# Patient Record
Sex: Female | Born: 1995 | Race: White | Hispanic: No | Marital: Single
Health system: Midwestern US, Community
[De-identification: ages and names within clinical notes are randomized; demographics above are authoritative.]

## PROBLEM LIST (undated history)

## (undated) DIAGNOSIS — E119 Type 2 diabetes mellitus without complications: Secondary | ICD-10-CM

## (undated) DIAGNOSIS — E079 Disorder of thyroid, unspecified: Secondary | ICD-10-CM

## (undated) DIAGNOSIS — D126 Benign neoplasm of colon, unspecified: Secondary | ICD-10-CM

## (undated) DIAGNOSIS — K449 Diaphragmatic hernia without obstruction or gangrene: Secondary | ICD-10-CM

## (undated) DIAGNOSIS — K760 Fatty (change of) liver, not elsewhere classified: Secondary | ICD-10-CM

## (undated) DIAGNOSIS — K219 Gastro-esophageal reflux disease without esophagitis: Secondary | ICD-10-CM

## (undated) DIAGNOSIS — D1391 Familial adenomatous polyposis: Secondary | ICD-10-CM

## (undated) DIAGNOSIS — E282 Polycystic ovarian syndrome: Secondary | ICD-10-CM

## (undated) HISTORY — PX: CYST REMOVAL LEG: SHX6280

## (undated) HISTORY — PX: COLON SURGERY: SHX602

---

## 2017-04-20 ENCOUNTER — Encounter (HOSPITAL_COMMUNITY): Payer: Self-pay | Admitting: Emergency Medicine

## 2017-04-20 ENCOUNTER — Emergency Department (HOSPITAL_COMMUNITY)
Admission: EM | Admit: 2017-04-20 | Discharge: 2017-04-20 | Disposition: A | Discharge status: Home / Self Care | Payer: Self-pay | Attending: Emergency Medicine | Admitting: Emergency Medicine

## 2017-04-20 DIAGNOSIS — B9789 Other viral agents as the cause of diseases classified elsewhere: Secondary | ICD-10-CM

## 2017-04-20 DIAGNOSIS — J069 Acute upper respiratory infection, unspecified: Secondary | ICD-10-CM | POA: Insufficient documentation

## 2017-04-20 DIAGNOSIS — F17210 Nicotine dependence, cigarettes, uncomplicated: Secondary | ICD-10-CM | POA: Insufficient documentation

## 2017-04-20 HISTORY — DX: Polycystic ovarian syndrome: E28.2

## 2017-04-20 HISTORY — DX: Gastro-esophageal reflux disease without esophagitis: K21.9

## 2017-04-20 MED ORDER — BENZONATATE 100 MG PO CAPS: 100.0000 mg | ORAL_CAPSULE | Freq: Three times a day (TID) | ORAL | 0 refills | Status: DC

## 2017-04-20 MED ORDER — PSEUDOEPHEDRINE HCL 30 MG PO TABS: 30.0000 mg | ORAL_TABLET | Freq: Four times a day (QID) | ORAL | 0 refills | Status: DC | PRN

## 2017-04-20 NOTE — ED Provider Notes (Signed)
[facility] DEPT Provider Note   CSN: [id number] Arrival date & time: 04/20/17  1735     History   Chief Complaint Chief Complaint  Patient presents with  . Cerumen Impaction    HPI Karen Drake is a 22 y.o. female who present to the ED with c/o decreased hearing out of the right ear. Patient tried using Q-tips and it seem to make it worse. Patient reports sinus congestion, dry cough and post nasal drainage. The symptoms started a few days ago.  HPI  Past Medical History:  Diagnosis Date  . Acid reflux   . Polycystic ovary syndrome     There are no active problems to display for this patient.   Past Surgical History:  Procedure Laterality Date  . COLON SURGERY       OB History   None      Home Medications    Prior to Admission medications   Medication Sig Start Date End Date Taking? Authorizing Provider  benzonatate (TESSALON) 100 MG capsule Take 1 capsule (100 mg total) by mouth every 8 (eight) hours. 04/20/17   Ashley Murrain, NP  pseudoephedrine (SUDAFED) 30 MG tablet Take 1 tablet (30 mg total) by mouth every 6 (six) hours as needed for congestion. 04/20/17   Ashley Murrain, NP    Family History No family history on file.  Social History Social History   Tobacco Use  . Smoking status: Current Every Day Smoker  . Smokeless tobacco: Never Used  Substance Use Topics  . Alcohol use: Not on file  . Drug use: Not on file     Allergies   Penicillins   Review of Systems Review of Systems  Constitutional: Negative for chills and fever.  HENT: Positive for ear pain (decreased hearing) and sinus pressure. Negative for sore throat.   Eyes: Negative for discharge and itching.  Respiratory: Positive for cough (dry). Negative for shortness of breath.   Gastrointestinal: Negative for abdominal pain, nausea and vomiting.  Musculoskeletal: Positive for myalgias.  Skin: Negative for rash.  Neurological: Negative for headaches.   Hematological: Negative for adenopathy.  Psychiatric/Behavioral: Negative for confusion.     Physical Exam Updated Vital Signs BP 116/88   Pulse 87   Temp 98.4 F (36.9 C) (Oral)   Resp 16   Ht 5\' 7"  (1.702 m)   Wt 72.6 kg (160 lb)   LMP 04/07/2017   SpO2 100%   BMI 25.06 kg/m   Physical Exam  Constitutional: She appears well-developed and well-nourished. No distress.  HENT:  Head: Normocephalic and atraumatic.  Right Ear: Tympanic membrane normal.  Left Ear: Tympanic membrane normal.  Nose: Mucosal edema and rhinorrhea present.  Mouth/Throat: Uvula is midline, oropharynx is clear and moist and mucous membranes are normal.  Eyes: EOM are normal.  Neck: Neck supple.  Cardiovascular: Normal rate and regular rhythm.  Pulmonary/Chest: Effort normal and breath sounds normal.  Musculoskeletal: Normal range of motion.  Neurological: She is alert.  Skin: Skin is warm and dry.  Psychiatric: She has a normal mood and affect. Her behavior is normal.  Nursing note and vitals reviewed.    ED Treatments / Results  Labs (all labs ordered are listed, but only abnormal results are displayed) Labs Reviewed - No data to display Radiology No results found.  Procedures Procedures (including critical care time)  Medications Ordered in ED Medications - No data to display   Initial Impression / Assessment and Plan / ED Course  I have reviewed the triage vital signs and the nursing notes.  Patients symptoms are consistent with URI, likely viral etiology. Discussed that antibiotics are not indicated for viral infections. Pt will be discharged with symptomatic treatment.  Verbalizes understanding and is agreeable with plan. Pt is hemodynamically stable & in NAD prior to dc. Final Clinical Impressions(s) / ED Diagnoses   Final diagnoses:  Viral URI with cough    ED Discharge Orders        Ordered    benzonatate (TESSALON) 100 MG capsule  Every 8 hours     04/20/17 1901     pseudoephedrine (SUDAFED) 30 MG tablet  Every 6 hours PRN     04/20/17 1902       Debroah Baller [place], NP 04/20/17 2219    Davonna Belling, MD 04/20/17 2329

## 2017-04-20 NOTE — ED Triage Notes (Signed)
Patient states she has been having diffiuclty hearing out of her right ear and now affecting her left ear. Pt tried qtips and OTC ear drops with no relief. Pt also believes she has sinus infection.

## 2017-04-20 NOTE — Discharge Instructions (Signed)
Take tylenol as needed for fever or pain, follow up with your doctor. Return here as needed.

## 2017-04-26 DIAGNOSIS — T7840XA Allergy, unspecified, initial encounter: Secondary | ICD-10-CM | POA: Insufficient documentation

## 2017-04-26 DIAGNOSIS — F17210 Nicotine dependence, cigarettes, uncomplicated: Secondary | ICD-10-CM | POA: Insufficient documentation

## 2017-04-27 ENCOUNTER — Encounter (HOSPITAL_COMMUNITY): Payer: Self-pay

## 2017-04-27 ENCOUNTER — Other Ambulatory Visit: Payer: Self-pay

## 2017-04-27 ENCOUNTER — Encounter (HOSPITAL_COMMUNITY): Payer: Self-pay | Admitting: Emergency Medicine

## 2017-04-27 ENCOUNTER — Emergency Department (HOSPITAL_COMMUNITY)
Admission: EM | Admit: 2017-04-27 | Discharge: 2017-04-27 | Disposition: A | Discharge status: Home / Self Care | Payer: Self-pay | Attending: Emergency Medicine | Admitting: Emergency Medicine

## 2017-04-27 DIAGNOSIS — Z79899 Other long term (current) drug therapy: Secondary | ICD-10-CM | POA: Insufficient documentation

## 2017-04-27 DIAGNOSIS — T7840XD Allergy, unspecified, subsequent encounter: Secondary | ICD-10-CM

## 2017-04-27 DIAGNOSIS — T7840XA Allergy, unspecified, initial encounter: Secondary | ICD-10-CM

## 2017-04-27 DIAGNOSIS — F17210 Nicotine dependence, cigarettes, uncomplicated: Secondary | ICD-10-CM | POA: Insufficient documentation

## 2017-04-27 HISTORY — DX: Fatty (change of) liver, not elsewhere classified: K76.0

## 2017-04-27 HISTORY — DX: Diaphragmatic hernia without obstruction or gangrene: K44.9

## 2017-04-27 MED ORDER — PREDNISONE 10 MG (21) PO TBPK: ORAL_TABLET | Freq: Every day | ORAL | 0 refills | Status: DC

## 2017-04-27 MED ORDER — DIPHENHYDRAMINE HCL 25 MG PO TABS: 25.0000 mg | ORAL_TABLET | Freq: Four times a day (QID) | ORAL | 0 refills | Status: DC

## 2017-04-27 MED ORDER — EPINEPHRINE 0.3 MG/0.3ML IJ SOAJ: 0.3000 mg | Freq: Once | INTRAMUSCULAR | 0 refills | Status: AC

## 2017-04-27 MED ORDER — FAMOTIDINE 20 MG PO TABS: 20.0000 mg | ORAL_TABLET | Freq: Two times a day (BID) | ORAL | 0 refills | Status: DC

## 2017-04-27 MED ORDER — DEXAMETHASONE SODIUM PHOSPHATE 10 MG/ML IJ SOLN
10.0000 mg | Freq: Once | INTRAMUSCULAR | Status: AC
  Administered 2017-04-27: 10 mg via INTRAMUSCULAR
  Filled 2017-04-27: qty 1

## 2017-04-27 MED ORDER — FAMOTIDINE 20 MG PO TABS
40.0000 mg | ORAL_TABLET | Freq: Once | ORAL | Status: AC
  Administered 2017-04-27: 40 mg via ORAL
  Filled 2017-04-27: qty 2

## 2017-04-27 NOTE — ED Provider Notes (Signed)
[facility] DEPT Provider Note   CSN: [id number] Arrival date & time: 04/27/17  1945     History   Chief Complaint Chief Complaint  Patient presents with  . Rash    HPI Karen Drake is a 22 y.o. female with history of PCOS who presents with rash to her left hand, itching of her face, sensation of throat tightening that began around 6:30 PM.  Patient reports she was evaluated here last evening after she was thought to maybe have bitten by a bug.  She was given a Decadron shot and sent home.  Patient was fine until 6:30 PM today when she developed the above symptoms.  She had associated nausea as well.  Patient reports mild shortness of breath related to her sensation of throat tightening.  She denies any abdominal pain, vomiting.  She denies any new soaps, detergents.  She has been on Tessalon for the past couple weeks and she took Excedrin migraine for the first time today.  She also tried chocolate pretzels and water flavoring today for the first time.  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  Patient took 50 mg Benadryl at 7 PM.  HPI  Past Medical History:  Diagnosis Date  . Acid reflux   . Fatty liver   . Hiatal hernia   . Polycystic ovary syndrome     There are no active problems to display for this patient.   Past Surgical History:  Procedure Laterality Date  . COLON SURGERY       OB History   None      Home Medications    Prior to Admission medications   Medication Sig Start Date End Date Taking? Authorizing Provider  benzonatate (TESSALON) 100 MG capsule Take 1 capsule (100 mg total) by mouth every 8 (eight) hours. 04/20/17   Ashley Murrain, NP  diphenhydrAMINE (BENADRYL) 25 MG tablet Take 1 tablet (25 mg total) by mouth every 6 (six) hours. 04/27/17   Sabino Denning, Bea Graff, PA-C  EPINEPHrine (EPIPEN 2-PAK) 0.3 mg/0.3 mL IJ SOAJ injection Inject 0.3 mLs (0.3 mg total) into the muscle once for 1 dose. 04/27/17  04/27/17  Frederica Kuster, PA-C  famotidine (PEPCID) 20 MG tablet Take 1 tablet (20 mg total) by mouth 2 (two) times daily. 04/27/17   Lilburn Straw, Bea Graff, PA-C  predniSONE (STERAPRED UNI-PAK 21 TAB) 10 MG (21) TBPK tablet Take by mouth daily. Take 6 tabs by mouth daily  for 2 days, then 5 tabs for 2 days, then 4 tabs for 2 days, then 3 tabs for 2 days, 2 tabs for 2 days, then 1 tab by mouth daily for 2 days 04/27/17   Frederica Kuster, PA-C  pseudoephedrine (SUDAFED) 30 MG tablet Take 1 tablet (30 mg total) by mouth every 6 (six) hours as needed for congestion. 04/20/17   Ashley Murrain, NP    Family History Family History  Problem Relation Age of Onset  . Cancer Other     Social History Social History   Tobacco Use  . Smoking status: Current Every Day Smoker    Types: Cigarettes  . Smokeless tobacco: Never Used  Substance Use Topics  . Alcohol use: Never    Frequency: Never  . Drug use: Never     Allergies   Penicillins   Review of Systems Review of Systems  Constitutional: Negative for chills and fever.  HENT: Negative for facial swelling, sore throat and trouble swallowing.  Respiratory: Positive for shortness of breath.   Cardiovascular: Negative for chest pain.  Gastrointestinal: Negative for abdominal pain, nausea and vomiting.  Genitourinary: Negative for dysuria.  Musculoskeletal: Negative for back pain.  Skin: Positive for rash. Negative for wound.  Neurological: Negative for headaches.  Psychiatric/Behavioral: The patient is not nervous/anxious.      Physical Exam Updated Vital Signs BP 132/84 (BP Location: Left Arm)   Pulse 100   Temp 97.9 F (36.6 C) (Oral)   Resp 18   Ht 5' 7.25" (1.708 m)   Wt 80.7 kg (178 lb)   LMP 04/07/2017   SpO2 98%   BMI 27.67 kg/m   Physical Exam  Constitutional: She appears well-developed and well-nourished. No distress.  HENT:  Head: Normocephalic and atraumatic.  Mouth/Throat: Oropharynx is clear and moist. No  oropharyngeal exudate.  Patient's oropharynx is clear, no swelling of the tongue, lips, posterior oropharynx  Eyes: Pupils are equal, round, and reactive to light. Conjunctivae are normal. Right eye exhibits no discharge. Left eye exhibits no discharge. No scleral icterus.  Neck: Normal range of motion. Neck supple. No thyromegaly present.  Cardiovascular: Normal rate, regular rhythm, normal heart sounds and intact distal pulses. Exam reveals no gallop and no friction rub.  No murmur heard. Pulmonary/Chest: Effort normal and breath sounds normal. No stridor. No respiratory distress. She has no wheezes. She has no rales.  Abdominal: Soft. Bowel sounds are normal. She exhibits no distension. There is no tenderness. There is no rebound and no guarding.  Musculoskeletal: She exhibits no edema.  Lymphadenopathy:    She has no cervical adenopathy.  Neurological: She is alert. Coordination normal.  Skin: Skin is warm and dry. No rash noted. She is not diaphoretic. No pallor.  Very subtle erythematous rash to dorsal thenar eminence of the left hand, no tenderness  Psychiatric: She has a normal mood and affect.  Nursing note and vitals reviewed.    ED Treatments / Results  Labs (all labs ordered are listed, but only abnormal results are displayed) Labs Reviewed - No data to display  EKG None  Radiology No results found.  Procedures Procedures (including critical care time)  Medications Ordered in ED Medications  famotidine (PEPCID) tablet 40 mg (has no administration in time range)     Initial Impression / Assessment and Plan / ED Course  I have reviewed the triage vital signs and the nursing notes.  Pertinent labs & imaging results that were available during my care of the patient were reviewed by me and considered in my medical decision making (see chart for details).     Patient with suspected allergic reaction.  Patient is very well-appearing with stable vitals.  She has  clear oropharynx and is sitting comfortably on the examination chair.  She is in no acute distress.  There is no stridor.  Lungs are clear to auscultation.  Patient given Pepcid in the ED.  Will discharge home with Pepcid, continue Benadryl, and steroid taper to begin tomorrow.  Patient also given EpiPen.  Patient counseled on reasons to use this and to go to nearest ED for evaluation and monitoring following use.  No signs of anaphylaxis at this time requiring EpiPen.  Strict return precautions given.  Will refer to PCP for allergy testing, as allergen unknown. Patient understands and agrees with plan.  Patient vital stable throughout ED course and discharged in satisfactory condition. I discussed patient case with Dr. Marcha Dutton who guided the patient's management and agrees with plan.  Final Clinical Impressions(s) / ED Diagnoses   Final diagnoses:  Allergic reaction, subsequent encounter    ED Discharge Orders        Ordered    predniSONE (STERAPRED UNI-PAK 21 TAB) 10 MG (21) TBPK tablet  Daily     04/27/17 2055    famotidine (PEPCID) 20 MG tablet  2 times daily     04/27/17 2055    diphenhydrAMINE (BENADRYL) 25 MG tablet  Every 6 hours     04/27/17 2055    EPINEPHrine (EPIPEN 2-PAK) 0.3 mg/0.3 mL IJ SOAJ injection   Once     04/27/17 2056       Frederica Kuster, PA-C 04/27/17 2106    Pixie Casino, MD 04/27/17 2112

## 2017-04-27 NOTE — Discharge Instructions (Addendum)
Please continue Benadryl as needed for itching Apply cool compresses to hand Return if worsening

## 2017-04-27 NOTE — Discharge Instructions (Signed)
Take Benadryl every 6 hours for the next several days.  Take Pepcid twice daily as well.  Beginning tomorrow the next day, start taking prednisone taper.  In the case of closing of your throat, severe shortness of breath, use EpiPen and proceed to the closest emergency department.  Have EpiPen on hand with you at all times.  Please follow-up with primary care office below for further management and referral to allergy testing.  Please return to the emergency department if you develop any new or worsening symptoms.

## 2017-04-27 NOTE — ED Triage Notes (Signed)
Pt reports that she was seen here yesterday for a allergic  reaction. Pt reports that she had a steroid shot yesterday, and today feels worse and has itching to her face and hands. Pt reports that she has been SOB but is able to talk in full sentences. Pt throat does not appear to be swollen.  Marland Kitchen

## 2017-04-27 NOTE — ED Provider Notes (Signed)
[facility] DEPT Provider Note   CSN: [id number] Arrival date & time: 04/26/17  2217     History   Chief Complaint Chief Complaint  Patient presents with  . Insect Bite    HPI Karen Drake is a 22 y.o. female who presents with redness and swelling of her right hand.  He states that she is outside letting her dogs out when she started to have pain in her right hand.  She noticed that there was some redness over the dorsal aspect of her hand and it was mildly swollen.  This was about 230 this afternoon.  Since then she has had progressive numbness and tingling of her hand and it has been radiating up her arm into her face.  She also feels this on her left arm as well.  She took Benadryl without significant relief therefore she came to the ED.  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  She denies throat swelling, shortness of breath, wheezing, nausea or vomiting.  HPI  Past Medical History:  Diagnosis Date  . Acid reflux   . Fatty liver   . Hiatal hernia   . Polycystic ovary syndrome     There are no active problems to display for this patient.   Past Surgical History:  Procedure Laterality Date  . COLON SURGERY       OB History   None      Home Medications    Prior to Admission medications   Medication Sig Start Date End Date Taking? Authorizing Provider  benzonatate (TESSALON) 100 MG capsule Take 1 capsule (100 mg total) by mouth every 8 (eight) hours. 04/20/17   Ashley Murrain, NP  pseudoephedrine (SUDAFED) 30 MG tablet Take 1 tablet (30 mg total) by mouth every 6 (six) hours as needed for congestion. 04/20/17   Ashley Murrain, NP    Family History Family History  Problem Relation Age of Onset  . Cancer Other     Social History Social History   Tobacco Use  . Smoking status: Current Every Day Smoker    Types: Cigarettes  . Smokeless tobacco: Never Used  Substance Use  Topics  . Alcohol use: Never    Frequency: Never  . Drug use: Never     Allergies   Penicillins   Review of Systems Review of Systems  HENT: Negative for trouble swallowing.   Respiratory: Negative for shortness of breath.   Gastrointestinal: Negative for abdominal pain, nausea and vomiting.  Skin: Positive for color change and rash.     Physical Exam Updated Vital Signs BP 117/80 (BP Location: Left Arm)   Pulse 84   Temp 98.7 F (37.1 C) (Oral)   Resp 15   Ht 5\' 7"  (1.702 m)   Wt 81 kg (178 lb 8 oz)   LMP 04/07/2017   SpO2 99%   BMI 27.96 kg/m   Physical Exam  Constitutional: She is oriented to person, place, and time. She appears well-developed and well-nourished. No distress.  Calm, cooperative, no acute distress  HENT:  Head: Normocephalic and atraumatic.  Eyes: Pupils are equal, round, and reactive to light. Conjunctivae are normal. Right eye exhibits no discharge. Left eye exhibits no discharge. No scleral icterus.  Neck: Normal range of motion.  Cardiovascular: Normal rate and regular rhythm.  Pulmonary/Chest: Effort normal and breath sounds normal. No respiratory distress.  Abdominal: She exhibits no distension.  Musculoskeletal:  Mild redness and swelling over the dorsal aspect of the right hand.  2+ radial pulse.  Neurological: She is alert and oriented to person, place, and time.  Skin: Skin is warm and dry.  Psychiatric: She has a normal mood and affect. Her behavior is normal.  Nursing note and vitals reviewed.    ED Treatments / Results  Labs (all labs ordered are listed, but only abnormal results are displayed) Labs Reviewed - No data to display  EKG None  Radiology No results found.  Procedures Procedures (including critical care time)  Medications Ordered in ED Medications  dexamethasone (DECADRON) injection 10 mg (has no administration in time range)     Initial Impression / Assessment and Plan / ED Course  I have reviewed  the triage vital signs and the nursing notes.  Pertinent labs & imaging results that were available during my care of the patient were reviewed by me and considered in my medical decision making (see chart for details).  22 year old female with possible allergic reaction.  Her vital signs are normal.  On exam she is in no acute distress.  She reports no improvement with Benadryl.  Will give IM Decadron and advised to continue Benadryl and apply cool compresses to the area.  Advised to return if worsening.  Final Clinical Impressions(s) / ED Diagnoses   Final diagnoses:  Allergic reaction, initial encounter    ED Discharge Orders    None       Recardo Evangelist, PA-C 04/27/17 0050    Ward, Delice Bison, DO 04/27/17 [phone]

## 2017-04-27 NOTE — ED Triage Notes (Signed)
Pt states she went outside today around 230 and when she came back in her right hand was red and she had pain in it  Since the pain has gone up her arm and now she is c/o numbness to her face  Pt states she took 25mg  of benadryl around 2130   Pt has redness noted to her hand

## 2017-06-27 ENCOUNTER — Other Ambulatory Visit: Payer: Self-pay

## 2017-06-27 ENCOUNTER — Emergency Department (HOSPITAL_COMMUNITY)
Admission: EM | Admit: 2017-06-27 | Discharge: 2017-06-27 | Disposition: A | Discharge status: Home / Self Care | Payer: Medicaid Other | Attending: Emergency Medicine | Admitting: Emergency Medicine

## 2017-06-27 ENCOUNTER — Encounter (HOSPITAL_COMMUNITY): Payer: Self-pay

## 2017-06-27 DIAGNOSIS — K2100 Gastro-esophageal reflux disease with esophagitis, without bleeding: Secondary | ICD-10-CM

## 2017-06-27 DIAGNOSIS — F17210 Nicotine dependence, cigarettes, uncomplicated: Secondary | ICD-10-CM | POA: Insufficient documentation

## 2017-06-27 DIAGNOSIS — G43009 Migraine without aura, not intractable, without status migrainosus: Secondary | ICD-10-CM

## 2017-06-27 DIAGNOSIS — K219 Gastro-esophageal reflux disease without esophagitis: Secondary | ICD-10-CM | POA: Insufficient documentation

## 2017-06-27 LAB — URINALYSIS, ROUTINE W REFLEX MICROSCOPIC
Bilirubin Urine: NEGATIVE
Glucose, UA: NEGATIVE mg/dL
Hgb urine dipstick: NEGATIVE
KETONES UR: NEGATIVE mg/dL
LEUKOCYTES UA: NEGATIVE
NITRITE: NEGATIVE
PH: 5 (ref 5.0–8.0)
Protein, ur: NEGATIVE mg/dL
SPECIFIC GRAVITY, URINE: 1.009 (ref 1.005–1.030)

## 2017-06-27 LAB — CBC
HEMATOCRIT: 41.1 % (ref 36.0–46.0)
HEMOGLOBIN: 13.5 g/dL (ref 12.0–15.0)
MCH: 29.2 pg (ref 26.0–34.0)
MCHC: 32.8 g/dL (ref 30.0–36.0)
MCV: 88.8 fL (ref 78.0–100.0)
Platelets: 355 10*3/uL (ref 150–400)
RBC: 4.63 MIL/uL (ref 3.87–5.11)
RDW: 13.3 % (ref 11.5–15.5)
WBC: 9.4 10*3/uL (ref 4.0–10.5)

## 2017-06-27 LAB — COMPREHENSIVE METABOLIC PANEL
ALT: 22 U/L (ref 0–44)
ANION GAP: 8 (ref 5–15)
AST: 29 U/L (ref 15–41)
Albumin: 4.4 g/dL (ref 3.5–5.0)
Alkaline Phosphatase: 50 U/L (ref 38–126)
BILIRUBIN TOTAL: 0.7 mg/dL (ref 0.3–1.2)
BUN: 16 mg/dL (ref 6–20)
CALCIUM: 9.5 mg/dL (ref 8.9–10.3)
CO2: 25 mmol/L (ref 22–32)
Chloride: 107 mmol/L (ref 98–111)
Creatinine, Ser: 1 mg/dL (ref 0.44–1.00)
GFR calc Af Amer: >60 mL/min (ref >60)
Glucose, Bld: 105 mg/dL — ABNORMAL HIGH (ref 70–99)
POTASSIUM: 4.6 mmol/L (ref 3.5–5.1)
Sodium: 140 mmol/L (ref 135–145)
TOTAL PROTEIN: 7.5 g/dL (ref 6.5–8.1)

## 2017-06-27 LAB — I-STAT BETA HCG BLOOD, ED (MC, WL, AP ONLY)

## 2017-06-27 LAB — LIPASE, BLOOD: Lipase: 35 U/L (ref 11–51)

## 2017-06-27 MED ORDER — SODIUM CHLORIDE 0.9 % IV BOLUS
1000.0000 mL | Freq: Once | INTRAVENOUS | Status: AC
  Administered 2017-06-27: 1000 mL via INTRAVENOUS

## 2017-06-27 MED ORDER — SUCRALFATE 1 G PO TABS
1.0000 g | ORAL_TABLET | Freq: Once | ORAL | Status: AC
  Administered 2017-06-27: 1 g via ORAL
  Filled 2017-06-27: qty 1

## 2017-06-27 MED ORDER — METOCLOPRAMIDE HCL 5 MG/ML IJ SOLN
10.0000 mg | Freq: Once | INTRAMUSCULAR | Status: AC
Start: 2017-06-27 — End: 2017-06-27
  Administered 2017-06-27: 10 mg via INTRAVENOUS
  Filled 2017-06-27: qty 2

## 2017-06-27 MED ORDER — DIPHENHYDRAMINE HCL 50 MG/ML IJ SOLN
12.5000 mg | Freq: Once | INTRAMUSCULAR | Status: AC
  Administered 2017-06-27: 12.5 mg via INTRAVENOUS
  Filled 2017-06-27: qty 1

## 2017-06-27 MED ORDER — PANTOPRAZOLE SODIUM 40 MG IV SOLR
40.0000 mg | Freq: Once | INTRAVENOUS | Status: AC
  Administered 2017-06-27: 40 mg via INTRAVENOUS
  Filled 2017-06-27: qty 40

## 2017-06-27 MED ORDER — SODIUM CHLORIDE 0.9 % IV SOLN
INTRAVENOUS | Status: DC
  Administered 2017-06-27: 17:00:00 via INTRAVENOUS

## 2017-06-27 NOTE — ED Provider Notes (Signed)
[facility] DEPT Provider Note   CSN: [id number] Arrival date & time: 06/27/17  1337     History   Chief Complaint Chief Complaint  Patient presents with  . Emesis  . Migraine  . Abdominal Pain    HPI Karen Drake is a 22 y.o. female.  This is a 22 year old female presents with epigastric abdominal pain times several days.  Patient has a known history of acid reflux as well as hiatal hernia and this feels similar.  Is been unresponsive to her home medications.  Her emesis has been nonbilious or bloody.  No fever or chills.  Pain does not radiate to her back.  No associated urinary or GYN symptoms such as vaginal bleeding or discharge.  Has been belching more.  Has not had much of an appetite.  Does have a gastroenterologist but she has not seen him recently.     Past Medical History:  Diagnosis Date  . Acid reflux   . Fatty liver   . Hiatal hernia   . Polycystic ovary syndrome     There are no active problems to display for this patient.   Past Surgical History:  Procedure Laterality Date  . COLON SURGERY       OB History   None      Home Medications    Prior to Admission medications   Medication Sig Start Date End Date Taking? Authorizing Provider  benzonatate (TESSALON) 100 MG capsule Take 1 capsule (100 mg total) by mouth every 8 (eight) hours. 04/20/17   Ashley Murrain, NP  diphenhydrAMINE (BENADRYL) 25 MG tablet Take 1 tablet (25 mg total) by mouth every 6 (six) hours. 04/27/17   Law, Bea Graff, PA-C  famotidine (PEPCID) 20 MG tablet Take 1 tablet (20 mg total) by mouth 2 (two) times daily. 04/27/17   Law, Alexandra M, PA-C  MICROGESTIN 1-20 MG-MCG tablet Take 1 tablet by mouth daily. 04/18/17   [provider]  predniSONE (STERAPRED UNI-PAK 21 TAB) 10 MG (21) TBPK tablet Take by mouth daily. Take 6 tabs by mouth daily  for 2 days, then 5 tabs for 2 days, then 4 tabs for 2 days, then 3 tabs for 2 days, 2 tabs for 2  days, then 1 tab by mouth daily for 2 days 04/27/17   Frederica Kuster, PA-C  pseudoephedrine (SUDAFED) 30 MG tablet Take 1 tablet (30 mg total) by mouth every 6 (six) hours as needed for congestion. 04/20/17   Ashley Murrain, NP    Family History Family History  Problem Relation Age of Onset  . Cancer Other     Social History Social History   Tobacco Use  . Smoking status: Current Every Day Smoker    Packs/day: 1.00    Types: Cigarettes  . Smokeless tobacco: Never Used  Substance Use Topics  . Alcohol use: Never    Frequency: Never  . Drug use: Never     Allergies   Penicillins and Pineapple   Review of Systems Review of Systems  All other systems reviewed and are negative.    Physical Exam Updated Vital Signs BP 121/82 (BP Location: Left Arm)   Pulse 98   Temp 97.9 F (36.6 C) (Oral)   Resp 18   Ht 1.708 m (5' 7.25")   Wt 81.2 kg (179 lb)   LMP 04/27/2017 Comment: irregular periods  SpO2 100%   BMI 27.83 kg/m   Physical Exam  Constitutional: She is oriented to person,  place, and time. She appears well-developed and well-nourished.  Non-toxic appearance. No distress.  HENT:  Head: Normocephalic and atraumatic.  Eyes: Pupils are equal, round, and reactive to light. Conjunctivae, EOM and lids are normal.  Neck: Normal range of motion. Neck supple. No tracheal deviation present. No thyroid mass present.  Cardiovascular: Normal rate, regular rhythm and normal heart sounds. Exam reveals no gallop.  No murmur heard. Pulmonary/Chest: Effort normal and breath sounds normal. No stridor. No respiratory distress. She has no decreased breath sounds. She has no wheezes. She has no rhonchi. She has no rales.  Abdominal: Soft. Normal appearance and bowel sounds are normal. She exhibits no distension. There is tenderness in the epigastric area. There is no rigidity, no rebound, no guarding and no CVA tenderness.    Musculoskeletal: Normal range of motion. She exhibits no  edema or tenderness.  Neurological: She is alert and oriented to person, place, and time. She has normal strength. No cranial nerve deficit or sensory deficit. GCS eye subscore is 4. GCS verbal subscore is 5. GCS motor subscore is 6.  Skin: Skin is warm and dry. No abrasion and no rash noted.  Psychiatric: She has a normal mood and affect. Her speech is normal and behavior is normal.  Nursing note and vitals reviewed.    ED Treatments / Results  Labs (all labs ordered are listed, but only abnormal results are displayed) Labs Reviewed  COMPREHENSIVE METABOLIC PANEL - Abnormal; Notable for the following components:      Result Value   Glucose, Bld 105 (*)    All other components within normal limits  LIPASE, BLOOD  CBC  URINALYSIS, ROUTINE W REFLEX MICROSCOPIC  I-STAT BETA HCG BLOOD, ED ([facility], [facility], [facility] ONLY)    EKG None  Radiology No results found.  Procedures Procedures (including critical care time)  Medications Ordered in ED Medications  sodium chloride 0.9 % bolus 1,000 mL (has no administration in time range)  0.9 %  sodium chloride infusion (has no administration in time range)  metoCLOPramide (REGLAN) injection 10 mg (has no administration in time range)  diphenhydrAMINE (BENADRYL) injection 12.5 mg (has no administration in time range)  sucralfate (CARAFATE) tablet 1 g (has no administration in time range)  pantoprazole (PROTONIX) injection 40 mg (has no administration in time range)     Initial Impression / Assessment and Plan / ED Course  I have reviewed the triage vital signs and the nursing notes.  Pertinent labs & imaging results that were available during my care of the patient were reviewed by me and considered in my medical decision making (see chart for details).     Patient treated with IV fluids as well as given antiemetics for her migraine headache.  Also treated for acid reflux and feels much better at this time.  No gross focal neurological deficits.   She is stable for discharge  Final Clinical Impressions(s) / ED Diagnoses   Final diagnoses:  None    ED Discharge Orders    None       Lacretia Leigh, MD 06/27/17 1807

## 2017-06-27 NOTE — ED Triage Notes (Signed)
Patient c/o vomiting, abdominal burning, and migraine headache since this AM.

## 2017-10-04 ENCOUNTER — Encounter (HOSPITAL_COMMUNITY): Payer: Self-pay | Admitting: Emergency Medicine

## 2017-10-04 ENCOUNTER — Emergency Department (HOSPITAL_COMMUNITY)
Admission: EM | Admit: 2017-10-04 | Discharge: 2017-10-04 | Disposition: A | Discharge status: Home / Self Care | Payer: Medicaid Other | Attending: Emergency Medicine | Admitting: Emergency Medicine

## 2017-10-04 DIAGNOSIS — L03039 Cellulitis of unspecified toe: Secondary | ICD-10-CM | POA: Insufficient documentation

## 2017-10-04 DIAGNOSIS — L03032 Cellulitis of left toe: Secondary | ICD-10-CM

## 2017-10-04 DIAGNOSIS — F17210 Nicotine dependence, cigarettes, uncomplicated: Secondary | ICD-10-CM | POA: Insufficient documentation

## 2017-10-04 MED ORDER — LIDOCAINE HCL 2 % IJ SOLN
20.0000 mL | Freq: Once | INTRAMUSCULAR | Status: AC
  Administered 2017-10-04: 400 mg via INTRADERMAL
  Filled 2017-10-04: qty 20

## 2017-10-04 NOTE — ED Provider Notes (Signed)
[facility] DEPT Provider Note   CSN: [id number] Arrival date & time: 10/04/17  1438     History   Chief Complaint Chief Complaint  Patient presents with  . Toe Pain    HPI Karen Drake is a 22 y.o. female.  HPI   Karen Drake is a 22 year old female with no significant past medical history who presents to the emergency department for evaluation of left great toe infection.  Patient reports that she had a pedicure 2 weeks ago for the first time.  About a week ago she noticed some redness next to the great toe nail.  Today she noticed that there was a area of swelling which looks like it contains pus.  She reports that she has moderate pain over the area, particularly with weightbearing.  She has not taken any over-the-counter medications for symptoms.  She denies fevers, chills.  No recent injury to the foot or toe.  No history of diabetes or immunocompromise.  Past Medical History:  Diagnosis Date  . Acid reflux   . Fatty liver   . Hiatal hernia   . Polycystic ovary syndrome     There are no active problems to display for this patient.   Past Surgical History:  Procedure Laterality Date  . COLON SURGERY       OB History   None      Home Medications    Prior to Admission medications   Medication Sig Start Date End Date Taking? Authorizing Provider  aspirin-acetaminophen-caffeine (EXCEDRIN MIGRAINE) [phone] MG tablet Take 2 tablets by mouth every 6 (six) hours as needed for headache or migraine.    [provider]  benzonatate (TESSALON) 100 MG capsule Take 1 capsule (100 mg total) by mouth every 8 (eight) hours. Patient not taking: Reported on 06/27/2017 04/20/17   Ashley Murrain, NP  diphenhydrAMINE (BENADRYL) 25 MG tablet Take 1 tablet (25 mg total) by mouth every 6 (six) hours. Patient taking differently: Take 25 mg by mouth every 6 (six) hours as needed for itching or allergies.  04/27/17   Law, Bea Graff, PA-C    esomeprazole (NEXIUM) 40 MG capsule Take 40 mg by mouth daily at 12 noon.    [provider]  famotidine (PEPCID) 20 MG tablet Take 1 tablet (20 mg total) by mouth 2 (two) times daily. Patient not taking: Reported on 06/27/2017 04/27/17   Frederica Kuster, PA-C  MICROGESTIN 1-20 MG-MCG tablet Take 1 tablet by mouth daily. 04/18/17   [provider]  Multiple Vitamins-Minerals (MULTIVITAMIN GUMMIES ADULTS PO) Take 2 each by mouth daily.    [provider]  predniSONE (STERAPRED UNI-PAK 21 TAB) 10 MG (21) TBPK tablet Take by mouth daily. Take 6 tabs by mouth daily  for 2 days, then 5 tabs for 2 days, then 4 tabs for 2 days, then 3 tabs for 2 days, 2 tabs for 2 days, then 1 tab by mouth daily for 2 days Patient not taking: Reported on 06/27/2017 04/27/17   Frederica Kuster, PA-C  pseudoephedrine (SUDAFED) 30 MG tablet Take 1 tablet (30 mg total) by mouth every 6 (six) hours as needed for congestion. Patient not taking: Reported on 06/27/2017 04/20/17   Ashley Murrain, NP  ranitidine (ZANTAC) 300 MG tablet Take 300 mg by mouth daily.    [provider]    Family History Family History  Problem Relation Age of Onset  . Cancer Other     Social History Social History  Tobacco Use  . Smoking status: Current Every Day Smoker    Packs/day: 1.00    Types: Cigarettes  . Smokeless tobacco: Never Used  Substance Use Topics  . Alcohol use: Never    Frequency: Never  . Drug use: Never     Allergies   Penicillins and Pineapple   Review of Systems Review of Systems  Constitutional: Negative for chills and fever.  Musculoskeletal: Positive for arthralgias (left great toe).  Skin: Positive for color change (left great toe).     Physical Exam Updated Vital Signs BP 125/74 (BP Location: Left Arm)   Pulse 95   Temp 98.6 F (37 C) (Oral)   Resp 17   Ht 5' 7.25" (1.708 m)   Wt 90 kg   SpO2 99%   BMI 30.84 kg/m   Physical Exam  Constitutional: She  appears well-developed and well-nourished. No distress.  HENT:  Head: Normocephalic and atraumatic.  Eyes: Right eye exhibits no discharge. Left eye exhibits no discharge.  Pulmonary/Chest: Effort normal. No respiratory distress.  Musculoskeletal:  Left great toe with erythema and area of fluctuance lateral to the nail. No tenderness or swelling over the pad of the toe. Cap refill <2sec.   Neurological: She is alert. Coordination normal.  Skin: Skin is warm and dry. She is not diaphoretic.  Psychiatric: She has a normal mood and affect. Her behavior is normal.  Nursing note and vitals reviewed.   ED Treatments / Results  Labs (all labs ordered are listed, but only abnormal results are displayed) Labs Reviewed - No data to display  EKG None  Radiology No results found.  Procedures Drain paronychia Date/Time: 10/04/2017 5:42 PM Performed by: Glyn Ade, PA-C Authorized by: Glyn Ade, PA-C  Consent: Verbal consent obtained. Consent given by: patient Patient understanding: patient states understanding of the procedure being performed Patient consent: the patient's understanding of the procedure matches consent given Procedure consent: procedure consent matches procedure scheduled Relevant documents: relevant documents present and verified Site marked: the operative site was marked Patient identity confirmed: verbally with patient Time out: Immediately prior to procedure a "time out" was called to verify the correct patient, procedure, equipment, support staff and site/side marked as required. Preparation: Patient was prepped and draped in the usual sterile fashion. Local anesthesia used: yes Anesthesia: digital block  Anesthesia: Local anesthesia used: yes Local Anesthetic: lidocaine 2% without epinephrine Anesthetic total: 4 mL  Sedation: Patient sedated: no  Patient tolerance: Patient tolerated the procedure well with no immediate complications     (including critical care time)  Medications Ordered in ED Medications  lidocaine (XYLOCAINE) 2 % (with pres) injection 400 mg (400 mg Intradermal Given 10/04/17 1623)     Initial Impression / Assessment and Plan / ED Course  I have reviewed the triage vital signs and the nursing notes.  Pertinent labs & imaging results that were available during my care of the patient were reviewed by me and considered in my medical decision making (see chart for details).     Patient presents with paronychia which was amenable to incision and drainage in the emergency department.  No erythema, swelling or fluctuance over the pad of the toe to suggest felon.  Patient counseled on warm soaks at home.  Discussed return precautions and she agrees.  Final Clinical Impressions(s) / ED Diagnoses   Final diagnoses:  Paronychia of great toe of left foot    ED Discharge Orders    None  Glyn Ade, PA-C 10/04/17 1745    Jola Schmidt, MD 10/05/17 2139

## 2017-10-04 NOTE — ED Notes (Signed)
Patient verbalized understanding of discharge instructions, no questions. Patient ambulated out of ED with steady gait in no distress.  

## 2017-10-04 NOTE — Discharge Instructions (Signed)
Soak in warm soapy water at least twice a day for the next week.  We will continue to drain for a few days, this is normal.  Return to the ER if you have any new or concerning symptoms like fever, worsening redness or swelling over the toe.

## 2017-10-04 NOTE — ED Triage Notes (Signed)
Pt reports that she got a pedicure couple weeks ago and notice around left bug toe looks like pus. Reports is painful.

## 2017-10-30 ENCOUNTER — Encounter (HOSPITAL_COMMUNITY): Payer: Self-pay

## 2017-10-30 ENCOUNTER — Emergency Department (HOSPITAL_COMMUNITY): Payer: Self-pay

## 2017-10-30 ENCOUNTER — Other Ambulatory Visit: Payer: Self-pay

## 2017-10-30 ENCOUNTER — Emergency Department (HOSPITAL_COMMUNITY)
Admission: EM | Admit: 2017-10-30 | Discharge: 2017-10-31 | Disposition: A | Discharge status: Home / Self Care | Payer: Self-pay | Attending: Emergency Medicine | Admitting: Emergency Medicine

## 2017-10-30 DIAGNOSIS — E119 Type 2 diabetes mellitus without complications: Secondary | ICD-10-CM | POA: Insufficient documentation

## 2017-10-30 DIAGNOSIS — F17210 Nicotine dependence, cigarettes, uncomplicated: Secondary | ICD-10-CM | POA: Insufficient documentation

## 2017-10-30 DIAGNOSIS — R112 Nausea with vomiting, unspecified: Secondary | ICD-10-CM | POA: Insufficient documentation

## 2017-10-30 DIAGNOSIS — Z79899 Other long term (current) drug therapy: Secondary | ICD-10-CM | POA: Insufficient documentation

## 2017-10-30 DIAGNOSIS — R748 Abnormal levels of other serum enzymes: Secondary | ICD-10-CM

## 2017-10-30 DIAGNOSIS — R195 Other fecal abnormalities: Secondary | ICD-10-CM

## 2017-10-30 DIAGNOSIS — R1011 Right upper quadrant pain: Secondary | ICD-10-CM

## 2017-10-30 DIAGNOSIS — R1013 Epigastric pain: Secondary | ICD-10-CM

## 2017-10-30 DIAGNOSIS — R3129 Other microscopic hematuria: Secondary | ICD-10-CM

## 2017-10-30 HISTORY — DX: Disorder of thyroid, unspecified: E07.9

## 2017-10-30 HISTORY — DX: Type 2 diabetes mellitus without complications: E11.9

## 2017-10-30 LAB — CBC
HEMATOCRIT: 40.6 % (ref 36.0–46.0)
HEMATOCRIT: 39.5 % (ref 36.0–46.0)
HEMOGLOBIN: 12.9 g/dL (ref 12.0–15.0)
HEMOGLOBIN: 12.6 g/dL (ref 12.0–15.0)
MCH: 28.9 pg (ref 26.0–34.0)
MCH: 29 pg (ref 26.0–34.0)
MCHC: 31.8 g/dL (ref 30.0–36.0)
MCHC: 31.9 g/dL (ref 30.0–36.0)
MCV: 91 fL (ref 80.0–100.0)
MCV: 90.8 fL (ref 80.0–100.0)
Platelets: 293 10*3/uL (ref 150–400)
Platelets: 342 10*3/uL (ref 150–400)
RBC: 4.46 MIL/uL (ref 3.87–5.11)
RBC: 4.35 MIL/uL (ref 3.87–5.11)
RDW: 13.3 % (ref 11.5–15.5)
RDW: 13.3 % (ref 11.5–15.5)
WBC: 10.6 10*3/uL — AB (ref 4.0–10.5)
WBC: 11.9 10*3/uL — ABNORMAL HIGH (ref 4.0–10.5)
nRBC: 0 % (ref 0.0–0.2)
nRBC: 0 % (ref 0.0–0.2)

## 2017-10-30 LAB — COMPREHENSIVE METABOLIC PANEL
ALBUMIN: 4.4 g/dL (ref 3.5–5.0)
ALK PHOS: 45 U/L (ref 38–126)
ALT: 45 U/L — AB (ref 0–44)
AST: 94 U/L — AB (ref 15–41)
Anion gap: 10 (ref 5–15)
BILIRUBIN TOTAL: 0.5 mg/dL (ref 0.3–1.2)
BUN: 10 mg/dL (ref 6–20)
CHLORIDE: 105 mmol/L (ref 98–111)
CO2: 23 mmol/L (ref 22–32)
Calcium: 9.5 mg/dL (ref 8.9–10.3)
Creatinine, Ser: 0.83 mg/dL (ref 0.44–1.00)
GFR calc Af Amer: >60 mL/min (ref >60)
GLUCOSE: 108 mg/dL — AB (ref 70–99)
POTASSIUM: 3.5 mmol/L (ref 3.5–5.1)
Sodium: 138 mmol/L (ref 135–145)
Total Protein: 7.5 g/dL (ref 6.5–8.1)

## 2017-10-30 LAB — POC OCCULT BLOOD, ED
FECAL OCCULT BLD: POSITIVE — AB
Fecal Occult Bld: NEGATIVE

## 2017-10-30 LAB — I-STAT BETA HCG BLOOD, ED (MC, WL, AP ONLY)

## 2017-10-30 LAB — TYPE AND SCREEN
ABO/RH(D): O POS
ANTIBODY SCREEN: NEGATIVE

## 2017-10-30 MED ORDER — SODIUM CHLORIDE 0.9 % IV BOLUS: 1000.0000 mL | Freq: Once | INTRAVENOUS | Status: DC

## 2017-10-30 MED ORDER — SODIUM CHLORIDE 0.9 % IV BOLUS
1000.0000 mL | Freq: Once | INTRAVENOUS | Status: AC
  Administered 2017-10-30: 1000 mL via INTRAVENOUS

## 2017-10-30 NOTE — ED Provider Notes (Signed)
[facility] DEPT Provider Note   CSN: [id number] Arrival date & time: 10/30/17  [place]     History   Chief Complaint Chief Complaint  Patient presents with  . GI Bleeding  . Abdominal Pain    HPI Karen Drake is a 22 y.o. female.  HPI  Patient is a 22 year old female with a history of prediabetes, GERD, fatty liver, PCOS, borderline hypothyroidism, FAP with history of colectomy presenting for abnormal stools, and epigastric to right upper quadrant pain.  Patient reports that she had a EGD and flex sigmoidoscopy at [facility] per her regular schedule.  Patient reports that she had a biopsy of a polyp in the duodenum.  Patient reports that since then, she has an increase in her baseline number of stools per day, and has had approximately 15 loose stools over the past 3 days, appearing very dark in color.  Patient denies taking iron or Pepto-Bismol.  Patient denies any hematemesis or coffee-ground emesis patient does report nausea and low appetite.  Patient denies any fever or chills.  Patient reports that the pain in her abdomen is intermittently sharp in nature, nonradiating.  Patient reports that she has not been eating, so she does not have an assessment of how food has affected it.  Patient denies any dysuria, urgency, or frequency.  Patient does reports feeling some lightheadedness upon standing.  No chest pain or shortness of breath.  Patient reports that the biopsy pathology is not back yet, but her gastro neurologist reported no abnormalities of the mucosa itself, no evidence of ulcer.  Past Medical History:  Diagnosis Date  . Acid reflux   . 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 Fatty liver   . Hiatal hernia   . Polycystic ovary syndrome   . Thyroid disease     There are no active problems to display for this patient.   Past Surgical History:  Procedure Laterality Date  . COLON SURGERY       OB History   None       Home Medications    Prior to Admission medications   Medication Sig Start Date End Date Taking? Authorizing Provider  aspirin-acetaminophen-caffeine (EXCEDRIN MIGRAINE) [phone] MG tablet Take 2 tablets by mouth every 6 (six) hours as needed for headache or migraine.   Yes [provider]  diphenhydrAMINE (BENADRYL) 25 MG tablet Take 1 tablet (25 mg total) by mouth every 6 (six) hours. Patient taking differently: Take 25 mg by mouth every 6 (six) hours as needed for itching or allergies.  04/27/17  Yes Law, Bea Graff, PA-C  levothyroxine (SYNTHROID, LEVOTHROID) 25 MCG tablet Take 25 mcg by mouth daily. 10/20/17  Yes [provider]  MICROGESTIN 1-20 MG-MCG tablet Take 1 tablet by mouth daily. 04/18/17  Yes [provider]  Multiple Vitamins-Minerals (MULTIVITAMIN GUMMIES ADULTS PO) Take 2 each by mouth daily.   Yes [provider]  benzonatate (TESSALON) 100 MG capsule Take 1 capsule (100 mg total) by mouth every 8 (eight) hours. Patient not taking: Reported on 06/27/2017 04/20/17   Ashley Murrain, NP  famotidine (PEPCID) 20 MG tablet Take 1 tablet (20 mg total) by mouth 2 (two) times daily. Patient not taking: Reported on 06/27/2017 04/27/17   Frederica Kuster, PA-C  predniSONE (STERAPRED UNI-PAK 21 TAB) 10 MG (21) TBPK tablet Take by mouth daily. Take 6 tabs by mouth daily  for 2 days, then 5 tabs for 2 days, then 4 tabs  for 2 days, then 3 tabs for 2 days, 2 tabs for 2 days, then 1 tab by mouth daily for 2 days Patient not taking: Reported on 06/27/2017 04/27/17   Frederica Kuster, PA-C  pseudoephedrine (SUDAFED) 30 MG tablet Take 1 tablet (30 mg total) by mouth every 6 (six) hours as needed for congestion. Patient not taking: Reported on 06/27/2017 04/20/17   Ashley Murrain, NP    Family History Family History  Problem Relation Age of Onset  . Cancer Other     Social History Social History   Tobacco Use  . Smoking status: Current Every Day Smoker     Packs/day: 0.25    Types: Cigarettes  . Smokeless tobacco: Never Used  Substance Use Topics  . Alcohol use: Never    Frequency: Never  . Drug use: Never     Allergies   Penicillins; Pineapple; and Tape   Review of Systems Review of Systems  Constitutional: Negative for chills and fever.  HENT: Negative for congestion and sore throat.   Respiratory: Negative for cough, chest tightness and shortness of breath.   Cardiovascular: Negative for chest pain, palpitations and leg swelling.  Gastrointestinal: Positive for abdominal pain, diarrhea and nausea. Negative for blood in stool and vomiting.       +Dark stools  Genitourinary: Negative for dysuria and flank pain.  Musculoskeletal: Negative for back pain and myalgias.  Skin: Negative for rash.  Neurological: Positive for light-headedness. Negative for dizziness and syncope.  All other systems reviewed and are negative.    Physical Exam Updated Vital Signs BP 110/67   Pulse 77   Temp 98.7 F (37.1 C) (Oral)   Resp (!) 21   Ht 5' 7.25" (1.708 m)   Wt 88.9 kg   SpO2 99%   BMI 30.47 kg/m   Physical Exam  Constitutional: She appears well-developed and well-nourished. No distress.  HENT:  Head: Normocephalic and atraumatic.  Mouth/Throat: Oropharynx is clear and moist.  Eyes: Pupils are equal, round, and reactive to light. Conjunctivae and EOM are normal.  Neck: Normal range of motion. Neck supple.  Cardiovascular: Normal rate, regular rhythm, S1 normal and S2 normal.  No murmur heard. Pulmonary/Chest: Effort normal and breath sounds normal. She has no wheezes. She has no rales.  Abdominal: Soft. She exhibits no distension. There is tenderness in the right upper quadrant and epigastric area. There is no guarding.  Mild epigastric to RUQ TTP.  Genitourinary:  Genitourinary Comments: Rectal examination performed with nurse tech chaperone present.  Patient has no thrombosed external hemorrhoids or prolapsed internal  hemorrhoids.  Normal rectal tone.  Patient has soft brown stool in rectal vault.  No melena or active bleeding.  Musculoskeletal: Normal range of motion. She exhibits no edema or deformity.  Lymphadenopathy:    She has no cervical adenopathy.  Neurological: She is alert.  Cranial nerves grossly intact. Patient moves extremities symmetrically and with good coordination.  Skin: Skin is warm and dry. No rash noted. No erythema.  Psychiatric: She has a normal mood and affect. Her behavior is normal. Judgment and thought content normal.  Nursing note and vitals reviewed.    ED Treatments / Results  Labs (all labs ordered are listed, but only abnormal results are displayed) Labs Reviewed  COMPREHENSIVE METABOLIC PANEL - Abnormal; Notable for the following components:      Result Value   Glucose, Bld 108 (*)    AST 94 (*)    ALT 45 (*)  All other components within normal limits  CBC - Abnormal; Notable for the following components:   WBC 10.6 (*)    All other components within normal limits  CBC - Abnormal; Notable for the following components:   WBC 11.9 (*)    All other components within normal limits  POC OCCULT BLOOD, ED - Abnormal; Notable for the following components:   Fecal Occult Bld POSITIVE (*)    All other components within normal limits  POC OCCULT BLOOD, ED  I-STAT BETA HCG BLOOD, ED ([facility], [facility], [facility] ONLY)  TYPE AND SCREEN  ABO/RH    EKG EKG Interpretation  Date/Time:  Monday October 30 2017 20:55:36 EDT Ventricular Rate:  81 PR Interval:    QRS Duration: 102 QT Interval:  350 QTC Calculation: 407 R Axis:   80 Text Interpretation:  Sinus rhythm RSR' in V1 or V2, right VCD or RVH Borderline T abnormalities, anterior leads No previous ECGs available Confirmed by Theotis Burrow [phone]) on 10/30/2017 8:58:09 PM   Radiology US Abdomen Limited Ruq  Result Date: 10/30/2017 CLINICAL DATA:  Right upper quadrant pain intermittently with nausea and vomiting over the  past 2 months. EXAM: ULTRASOUND ABDOMEN LIMITED RIGHT UPPER QUADRANT COMPARISON:  None. FINDINGS: Gallbladder: No gallstones or wall thickening visualized. The gallbladder is nondistended. No pericholecystic fluid. No sonographic Murphy sign noted by sonographer. Common bile duct: Diameter: 4.5 mm Liver: No focal lesion identified. Increased echogenicity of the liver. Portal vein is patent on color Doppler imaging with normal direction of blood flow towards the liver. IMPRESSION: Increased echogenicity of the liver with coarsening likely representing stigmata hepatic steatosis. Unremarkable appearance of the gallbladder. Electronically Signed   By: Ashley Royalty M.D.   On: 10/30/2017 22:25    Procedures Procedures (including critical care time)  Medications Ordered in ED Medications - No data to display   Initial Impression / Assessment and Plan / ED Course  I have reviewed the triage vital signs and the nursing notes.  Pertinent labs & imaging results that were available during my care of the patient were reviewed by me and considered in my medical decision making (see chart for details).  Clinical Course as of Oct 30 2329  Mon Oct 30, 2017  2041 History of elevated AST on 10/19/2017 at [facility].  AST(!): 94 [AM]  2223 Expected with recent biopsy result.   Fecal Occult Blood, POC(!): POSITIVE [AM]  2223 Only marginally elevated. Otherwise LFTs c/w prior studies on 10/19/17.  ALT(!): 45 [AM]  2325 Reassessed.  Patient is sleeping.  Repeat abdominal exam is benign and patient is having no abdominal tenderness.   [AM]  2328 Expected after procedure.  Fecal Occult Blood, POC(!): POSITIVE [AM]  2330 Discussed case with Dr. Theotis Burrow and no repeat hgb needed at this time.    [AM]    Clinical Course User Index [AM] Albesa Seen, PA-C    Patient is nontoxic-appearing, afebrile, and has a nonsurgical abdomen.  On reassessment the patient, her abdominal pain was diminishing without  intervention.  Differential diagnosis includes continuing bleeding from biopsy site, cholecystitis, cholelithiasis, or gastritis.   Work-up significant for hemoglobin stable x2.  Patient had slight elevation in her WBC count on serial examinations, however this may be a stress reaction, and patient is having diminishing abdominal pain without intervention.  Suspect that fecal occult blood is positive due to recent biopsy.  On exam, patient did not have melanotic stool on patient reports that it cleared up prior to  arrival.  Patient does have slight elevation in her AST, which was consistent with prior lab value taken on 10-19-2017, however given the recent development of epigastric right upper quadrant pain, will obtain right upper quadrant ultrasound.  11:31 PM Care signed out to Providence Lanius, PA-C to follow urinalysis and d/c after fluids. Pt did not meet orthostasis.   Discussed return precautions with patient including bright red blood per rectum or melena, abdominal pain, shortness of breath, chest pain, or lightheadedness.  Recommend restarting Carafate.  Patient was instructed to follow-up with her gastroenterologist.  Final Clinical Impressions(s) / ED Diagnoses   Final diagnoses:  RUQ pain  Epigastric pain  Dark stools  Elevated liver enzymes    ED Discharge Orders    None       Tamala Julian 10/30/17 2338    Little, Wenda Overland, MD 10/31/17 [phone]

## 2017-10-30 NOTE — ED Triage Notes (Signed)
Patient had an EGD, flex sigmoidoscopy, and a biopsy on 10/27/17. Patient states she had coffee ground stool x 2 days. Patient denies any vomiting, but does have generalized abdominal pain

## 2017-10-30 NOTE — ED Notes (Signed)
Bed: WA20 Expected date:  Expected time:  Means of arrival:  Comments: Tr 1

## 2017-10-30 NOTE — Discharge Instructions (Addendum)
Please see the information and instructions below regarding your visit.  Your diagnoses today include:  1. Epigastric pain   2. RUQ pain   3. Dark stools   4. Elevated liver enzymes     Tests performed today include: See side panel of your discharge paperwork for testing performed today. Vital signs are listed at the bottom of these instructions.   Your hemoglobin was stable twice today.  Your ultrasound was negative for any acute findings of the gallbladder.  Medications prescribed:    Take any prescribed medications only as prescribed, and any over the counter medications only as directed on the packaging.  Resume home medications.   Home care instructions:  Please follow any educational materials contained in this packet.   Follow-up instructions: Please follow-up with your gastroenterologist with a call tomorrow for further evaluation of your symptoms if they are not completely improved.   You will need repeat hemoglobin by the end of the week or early next week. Please also have your urine rechecked to make sure the small amount of blood is clearing.  Return instructions:  Please return to the Emergency Department if you experience worsening symptoms.  Please return for bright red blood per rectum or melena, abdominal pain, shortness of breath, chest pain, or lightheadedness.  Please return if you have any other emergent concerns.  Additional Information:   Your vital signs today were: BP 92/63 (BP Location: Right Arm) Comment: Patient is sleeping   Pulse 74    Temp 98.7 F (37.1 C) (Oral)    Resp 17    Ht 5' 7.25" (1.708 m)    Wt 88.9 kg    SpO2 98%    BMI 30.47 kg/m  If your blood pressure (BP) was elevated on multiple readings during this visit above 130 for the top number or above 80 for the bottom number, please have this repeated by your primary care provider within one month. --------------  Thank you for allowing [place] to participate in your care today.

## 2017-10-31 LAB — URINALYSIS, ROUTINE W REFLEX MICROSCOPIC
Bilirubin Urine: NEGATIVE
Glucose, UA: NEGATIVE mg/dL
Ketones, ur: NEGATIVE mg/dL
Leukocytes, UA: NEGATIVE
Nitrite: NEGATIVE
PROTEIN: NEGATIVE mg/dL
Specific Gravity, Urine: 1.015 (ref 1.005–1.030)
pH: 6 (ref 5.0–8.0)

## 2017-10-31 LAB — ABO/RH: ABO/RH(D): O POS

## 2017-11-13 ENCOUNTER — Emergency Department (HOSPITAL_COMMUNITY)
Admission: EM | Admit: 2017-11-13 | Discharge: 2017-11-13 | Discharge status: Against Medical Advice | Payer: Medicaid Other

## 2017-11-13 NOTE — ED Notes (Signed)
Patient not visualized in the lobby

## 2017-11-25 ENCOUNTER — Other Ambulatory Visit: Payer: Self-pay

## 2017-11-25 ENCOUNTER — Encounter (HOSPITAL_COMMUNITY): Payer: Self-pay | Admitting: Emergency Medicine

## 2017-11-25 DIAGNOSIS — L299 Pruritus, unspecified: Secondary | ICD-10-CM | POA: Insufficient documentation

## 2017-11-25 DIAGNOSIS — F17210 Nicotine dependence, cigarettes, uncomplicated: Secondary | ICD-10-CM | POA: Insufficient documentation

## 2017-11-25 DIAGNOSIS — Z79899 Other long term (current) drug therapy: Secondary | ICD-10-CM | POA: Insufficient documentation

## 2017-11-25 DIAGNOSIS — E079 Disorder of thyroid, unspecified: Secondary | ICD-10-CM | POA: Insufficient documentation

## 2017-11-25 DIAGNOSIS — R21 Rash and other nonspecific skin eruption: Secondary | ICD-10-CM | POA: Insufficient documentation

## 2017-11-25 NOTE — ED Triage Notes (Signed)
Pt presents with a rash on her hands that started about 2 weeks ago. Patient was seen by a doctor through work and given a steroid cream. Patient states the cream is not helping and she feels like the rash is worsening. Diffuse rash noted to hands only. Patient states face is starting to feel itchy also. Patient states that she may be allergic to the gloves and soap at work.

## 2017-11-26 ENCOUNTER — Emergency Department (HOSPITAL_COMMUNITY)
Admission: EM | Admit: 2017-11-26 | Discharge: 2017-11-26 | Disposition: A | Discharge status: Home / Self Care | Payer: Medicaid Other | Attending: Emergency Medicine | Admitting: Emergency Medicine

## 2017-11-26 DIAGNOSIS — R21 Rash and other nonspecific skin eruption: Secondary | ICD-10-CM

## 2017-11-26 MED ORDER — PREDNISONE 20 MG PO TABS
60.0000 mg | ORAL_TABLET | Freq: Once | ORAL | Status: AC
  Administered 2017-11-26: 60 mg via ORAL
  Filled 2017-11-26: qty 3

## 2017-11-26 MED ORDER — PREDNISONE 20 MG PO TABS: ORAL_TABLET | ORAL | 0 refills | Status: DC

## 2017-11-26 NOTE — ED Provider Notes (Signed)
[facility] DEPT Provider Note   CSN: [id number] Arrival date & time: 11/25/17  2326     History   Chief Complaint Chief Complaint  Patient presents with  . Rash    HPI Karen Drake is a 22 y.o. female.  The history is provided by the patient and medical records.  Rash       22 year old female with history of acid reflux, borderline diabetes, fatty liver, PCOS, thyroid disease, presenting to the ED for rash.  Patient reports about 2 weeks ago she was seen by Dr. at her work due to rash on dorsal aspects of her hands.  Felt like it was likely due to mixture of cleaners/soaps in the gloves that they provided.  She was started on some triamcinolone cream which she has been using twice a day since then.  States initially symptoms started getting better but has since started worsening again.  States at work she is tried to avoid using the particular gloves as before and has not been any contact with any new cleaners, soaps, bleach, etc.  At home she has not had any changes in soaps or detergents.  States her hands are itching and burning all the time despite using the Kenalog ointment.  She has not tried any other intervention PTA.  Past Medical History:  Diagnosis Date  . Acid reflux   . 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 Fatty liver   . Hiatal hernia   . Polycystic ovary syndrome   . Thyroid disease     There are no active problems to display for this patient.   Past Surgical History:  Procedure Laterality Date  . COLON SURGERY    . CYST REMOVAL LEG Right    posterior right knee     OB History   None      Home Medications    Prior to Admission medications   Medication Sig Start Date End Date Taking? Authorizing Provider  aspirin-acetaminophen-caffeine (EXCEDRIN MIGRAINE) [phone] MG tablet Take 2 tablets by mouth every 6 (six) hours as needed for headache or migraine.    [provider]    benzonatate (TESSALON) 100 MG capsule Take 1 capsule (100 mg total) by mouth every 8 (eight) hours. Patient not taking: Reported on 06/27/2017 04/20/17   Ashley Murrain, NP  diphenhydrAMINE (BENADRYL) 25 MG tablet Take 1 tablet (25 mg total) by mouth every 6 (six) hours. Patient taking differently: Take 25 mg by mouth every 6 (six) hours as needed for itching or allergies.  04/27/17   Law, Bea Graff, PA-C  famotidine (PEPCID) 20 MG tablet Take 1 tablet (20 mg total) by mouth 2 (two) times daily. Patient not taking: Reported on 06/27/2017 04/27/17   Frederica Kuster, PA-C  levothyroxine (SYNTHROID, LEVOTHROID) 25 MCG tablet Take 25 mcg by mouth daily. 10/20/17   [provider]  MICROGESTIN 1-20 MG-MCG tablet Take 1 tablet by mouth daily. 04/18/17   [provider]  Multiple Vitamins-Minerals (MULTIVITAMIN GUMMIES ADULTS PO) Take 2 each by mouth daily.    [provider]  predniSONE (STERAPRED UNI-PAK 21 TAB) 10 MG (21) TBPK tablet Take by mouth daily. Take 6 tabs by mouth daily  for 2 days, then 5 tabs for 2 days, then 4 tabs for 2 days, then 3 tabs for 2 days, 2 tabs for 2 days, then 1 tab by mouth daily for 2 days Patient not taking: Reported on 06/27/2017 04/27/17   Law,  Bea Graff, PA-C  pseudoephedrine (SUDAFED) 30 MG tablet Take 1 tablet (30 mg total) by mouth every 6 (six) hours as needed for congestion. Patient not taking: Reported on 06/27/2017 04/20/17   Ashley Murrain, NP    Family History Family History  Problem Relation Age of Onset  . Cancer Other     Social History Social History   Tobacco Use  . Smoking status: Current Every Day Smoker    Packs/day: 0.25    Types: Cigarettes  . Smokeless tobacco: Never Used  Substance Use Topics  . Alcohol use: Never    Frequency: Never  . Drug use: Never     Allergies   Penicillins; Pineapple; Nsaids; and Tape   Review of Systems Review of Systems  Skin: Positive for rash.  All other systems reviewed and  are negative.    Physical Exam Updated Vital Signs BP (!) 199/88 (BP Location: Left Arm)   Pulse 85   Temp 97.8 F (36.6 C) (Oral)   Resp 16   LMP 11/15/2017   SpO2 99%   Physical Exam  Constitutional: She is oriented to person, place, and time. She appears well-developed and well-nourished.  HENT:  Head: Normocephalic and atraumatic.  Mouth/Throat: Oropharynx is clear and moist.  Eyes: Pupils are equal, round, and reactive to light. Conjunctivae and EOM are normal.  Neck: Normal range of motion.  Cardiovascular: Normal rate, regular rhythm and normal heart sounds.  Pulmonary/Chest: Effort normal and breath sounds normal.  Abdominal: Soft. Bowel sounds are normal.  Musculoskeletal: Normal range of motion.  Neurological: She is alert and oriented to person, place, and time.  Skin: Skin is warm and dry. Rash noted.  Dorsal aspect of both hands with multiple areas of dry, scaly skin, mostly along the MCP joints and at proximal hand closest to wrist; there are no discrete lesions or vesicles; no lesions on the palms; slight swelling noted of the proximal fingers  Psychiatric: She has a normal mood and affect.  Nursing note and vitals reviewed.    ED Treatments / Results  Labs (all labs ordered are listed, but only abnormal results are displayed) Labs Reviewed - No data to display  EKG None  Radiology No results found.  Procedures Procedures (including critical care time)  Medications Ordered in ED Medications  predniSONE (DELTASONE) tablet 60 mg (has no administration in time range)     Initial Impression / Assessment and Plan / ED Course  I have reviewed the triage vital signs and the nursing notes.  Pertinent labs & imaging results that were available during my care of the patient were reviewed by me and considered in my medical decision making (see chart for details).  22 year old female here with rash of dorsal hands.  Present 2 weeks ago and improving but  now worsening again.  Reports initially rash felt to be related to cleaner/soaps and particular brand of gloves she was wearing at work.  She has not had any new exposures.  Has continued using the Kenalog ointment twice daily without relief.  On exam she has multiple areas of dry, scaly skin on the dorsal aspect of both of her hands.  This is most prominent along the MCP joints and at the proximal hand.  She does not have any discrete lesions or vesicles.  She has no lesions on the palms.  Slight swelling noted of the proximal fingers without any bony deformities.  This is most consistent with a contact dermatitis.  Will add oral  steroids in addition to topical.  Discussed keeping hands moist during the day.  She can use Benadryl for itching.  Close follow-up with PCP.  She will return here for any new/acute changes.  Final Clinical Impressions(s) / ED Diagnoses   Final diagnoses:  Rash    ED Discharge Orders         Ordered    predniSONE (DELTASONE) 20 MG tablet     11/26/17 0137           Larene Pickett, PA-C 11/26/17 0139    Merryl Hacker, MD 11/26/17 [phone]

## 2017-11-26 NOTE — Discharge Instructions (Signed)
Take the prescribed medication as directed.  Continue using the steroid cream. Try to keep the hands moist during the day-- lotion, vaseline, etc. Follow-up with your primary care doctor. Return to the ED for new or worsening symptoms.

## 2017-12-07 ENCOUNTER — Emergency Department (HOSPITAL_COMMUNITY): Payer: Self-pay

## 2017-12-07 ENCOUNTER — Emergency Department (HOSPITAL_COMMUNITY)
Admission: EM | Admit: 2017-12-07 | Discharge: 2017-12-07 | Disposition: A | Discharge status: Home / Self Care | Payer: Self-pay | Attending: Emergency Medicine | Admitting: Emergency Medicine

## 2017-12-07 ENCOUNTER — Other Ambulatory Visit: Payer: Self-pay

## 2017-12-07 DIAGNOSIS — Y999 Unspecified external cause status: Secondary | ICD-10-CM | POA: Insufficient documentation

## 2017-12-07 DIAGNOSIS — Y929 Unspecified place or not applicable: Secondary | ICD-10-CM | POA: Insufficient documentation

## 2017-12-07 DIAGNOSIS — W109XXA Fall (on) (from) unspecified stairs and steps, initial encounter: Secondary | ICD-10-CM | POA: Insufficient documentation

## 2017-12-07 DIAGNOSIS — S161XXA Strain of muscle, fascia and tendon at neck level, initial encounter: Secondary | ICD-10-CM | POA: Insufficient documentation

## 2017-12-07 DIAGNOSIS — Y939 Activity, unspecified: Secondary | ICD-10-CM | POA: Insufficient documentation

## 2017-12-07 DIAGNOSIS — T148XXA Other injury of unspecified body region, initial encounter: Secondary | ICD-10-CM

## 2017-12-07 DIAGNOSIS — Z79899 Other long term (current) drug therapy: Secondary | ICD-10-CM | POA: Insufficient documentation

## 2017-12-07 DIAGNOSIS — M5431 Sciatica, right side: Secondary | ICD-10-CM | POA: Insufficient documentation

## 2017-12-07 DIAGNOSIS — M549 Dorsalgia, unspecified: Secondary | ICD-10-CM

## 2017-12-07 DIAGNOSIS — W19XXXA Unspecified fall, initial encounter: Secondary | ICD-10-CM

## 2017-12-07 DIAGNOSIS — E119 Type 2 diabetes mellitus without complications: Secondary | ICD-10-CM | POA: Insufficient documentation

## 2017-12-07 DIAGNOSIS — F17210 Nicotine dependence, cigarettes, uncomplicated: Secondary | ICD-10-CM | POA: Insufficient documentation

## 2017-12-07 MED ORDER — METHOCARBAMOL 500 MG PO TABS: 500.0000 mg | ORAL_TABLET | Freq: Every evening | ORAL | 0 refills | Status: DC | PRN

## 2017-12-07 MED ORDER — NAPROXEN 500 MG PO TABS: 500.0000 mg | ORAL_TABLET | Freq: Two times a day (BID) | ORAL | 0 refills | Status: DC | PRN

## 2017-12-07 MED ORDER — KETOROLAC TROMETHAMINE 15 MG/ML IJ SOLN
15.0000 mg | Freq: Once | INTRAMUSCULAR | Status: AC
  Administered 2017-12-07: 15 mg via INTRAMUSCULAR
  Filled 2017-12-07: qty 1

## 2017-12-07 NOTE — Discharge Instructions (Signed)
Take naproxen 2 times a day with meals as needed for pain.  Do not take other anti-inflammatories at the same time (Advil, Motrin, ibuprofen, Aleve). You may supplement with Tylenol if you need further pain control. Use Robaxin as needed for muscle stiffness or soreness. Have caution, as this may make you tired or groggy. Do not drive or operate heavy machinery while taking this medication.  Use muscle creams (bengay, icy hot, salonpas) as needed for pain.  Use heat, ice, and stretching to help relieve your symptoms. Follow up with your primary care doctor if pain is not improving.  Return to the ER if you develop high fevers, numbness, loss of bowel or bladder control, or any new or concerning symptoms.

## 2017-12-07 NOTE — ED Triage Notes (Signed)
Pt to ed with c/o of fall. Pt states she had a puppy gate up and was trying to go over the gate and slipped which wound up with patient falling down 10 steps and woke up on back side. Pt hit head, back, right arm and right hand. Pt states pain is 10/10 on back of head, neck, right arm/shoulder, right leg from hip dow. Pt can barely walk and arrived in wheelchair to traige. Pt is A&O x4.

## 2017-12-07 NOTE — ED Provider Notes (Signed)
[facility] DEPT Provider Note   CSN: [id number] Arrival date & time: 12/07/17  1942     History   Chief Complaint Chief Complaint  Patient presents with  . Fall    HPI Karen Drake is a 22 y.o. female for evaluation after fall.  Patient states at midnight last night she tripped over a baby gate and fell down 10 stairs, landing on a hardwood floor.  This occurred approximately 20 hours ago.  Patient states she hit her head, laid there for about 15 minutes before standing up.  She does not think she lost consciousness.  Patient reports head pain, neck pain, back pain, right arm pain, and pain radiating down the back of her right leg.  Patient states pain is worse than a 10 out of 10, but she has not taken anything for this including Tylenol or ibuprofen.  Patient states pain has been gradually worsening today.  It is worse with movement and weightbearing.  She is not on blood thinners.  She denies vision changes, slurred speech, current headache, chest pain, shortness breath, nausea, vomiting or abdominal pain, loss of bowel bladder control.  Patient reports tingling of the right arm, but no overt numbness.  HPI  Past Medical History:  Diagnosis Date  . Acid reflux   . 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 Fatty liver   . Hiatal hernia   . Polycystic ovary syndrome   . Thyroid disease     There are no active problems to display for this patient.   Past Surgical History:  Procedure Laterality Date  . COLON SURGERY    . CYST REMOVAL LEG Right    posterior right knee     OB History   None      Home Medications    Prior to Admission medications   Medication Sig Start Date End Date Taking? Authorizing Provider  aspirin-acetaminophen-caffeine (EXCEDRIN MIGRAINE) [phone] MG tablet Take 2 tablets by mouth every 6 (six) hours as needed for headache or migraine.    [provider]  benzonatate (TESSALON)  100 MG capsule Take 1 capsule (100 mg total) by mouth every 8 (eight) hours. Patient not taking: Reported on 06/27/2017 04/20/17   Ashley Murrain, NP  diphenhydrAMINE (BENADRYL) 25 MG tablet Take 1 tablet (25 mg total) by mouth every 6 (six) hours. Patient taking differently: Take 25 mg by mouth every 6 (six) hours as needed for itching or allergies.  04/27/17   Law, Bea Graff, PA-C  famotidine (PEPCID) 20 MG tablet Take 1 tablet (20 mg total) by mouth 2 (two) times daily. Patient not taking: Reported on 06/27/2017 04/27/17   Frederica Kuster, PA-C  levothyroxine (SYNTHROID, LEVOTHROID) 25 MCG tablet Take 25 mcg by mouth daily. 10/20/17   [provider]  MICROGESTIN 1-20 MG-MCG tablet Take 1 tablet by mouth daily. 04/18/17   [provider]  Multiple Vitamins-Minerals (MULTIVITAMIN GUMMIES ADULTS PO) Take 2 each by mouth daily.    [provider]  predniSONE (DELTASONE) 20 MG tablet Take 40 mg by mouth daily for 3 days, then 20mg  by mouth daily for 3 days, then 10mg  daily for 3 days 11/26/17   Larene Pickett, PA-C  pseudoephedrine (SUDAFED) 30 MG tablet Take 1 tablet (30 mg total) by mouth every 6 (six) hours as needed for congestion. Patient not taking: Reported on 06/27/2017 04/20/17   Ashley Murrain, NP    Family History Family History  Problem  Relation Age of Onset  . Cancer Other     Social History Social History   Tobacco Use  . Smoking status: Current Every Day Smoker    Packs/day: 0.25    Types: Cigarettes  . Smokeless tobacco: Never Used  Substance Use Topics  . Alcohol use: Never    Frequency: Never  . Drug use: Never     Allergies   Penicillins; Pineapple; Nsaids; and Tape   Review of Systems Review of Systems  Musculoskeletal: Positive for arthralgias, back pain, myalgias and neck pain.  Hematological: Does not bruise/bleed easily.  All other systems reviewed and are negative.    Physical Exam Updated Vital Signs Ht 5\' 7"  (1.702 m)    Wt 89.4 kg   LMP 11/15/2017   BMI 30.85 kg/m   Physical Exam  Constitutional: She is oriented to person, place, and time. She appears well-developed and well-nourished. No distress.  Sitting comfortably in the chair in no distress  HENT:  Head: Normocephalic and atraumatic.  Right Ear: Tympanic membrane, external ear and ear canal normal.  Left Ear: Tympanic membrane, external ear and ear canal normal.  Nose: Nose normal.  Mouth/Throat: Uvula is midline, oropharynx is clear and moist and mucous membranes are normal.  No malocclusion. No TTP of head or scalp. No obvious laceration, hematoma or injury.   Eyes: Pupils are equal, round, and reactive to light. EOM are normal.  Neck: Normal range of motion. Neck supple.  Moving head easily during exam.  When performing range of motion, patient winces at all movements.  Patient reports tenderness palpation over midline C-spine and right side neck musculature.  Cardiovascular: Normal rate, regular rhythm and intact distal pulses.  Pulmonary/Chest: Effort normal and breath sounds normal. She exhibits no tenderness.  No TTP of the chest wall  Abdominal: Soft. She exhibits no distension. There is no tenderness.  No TTP of the abd  Musculoskeletal: She exhibits tenderness.  Tenderness palpation over entire right back musculature.  No increased focal pain over midline spine.  No tenderness palpation of left side back musculature.  Increased pain with straight leg raise.  Pedal pulses intact bilaterally.  Sensation of lower extremities intact bilaterally.  Patellar reflexes intact bilaterally.  No saddle paresthesias.  No step-offs or deformities noted of the back. Tenderness palpation of the entire right arm.  No obvious injury or deformity.  No laceration or swelling.  Radial pulses intact bilaterally.  Patient reports decrease sensation on the right arm.  Grip strength intact bilaterally.  Neurological: She is alert and oriented to person, place, and  time. She has normal strength. She displays normal reflexes. No cranial nerve deficit or sensory deficit. GCS eye subscore is 4. GCS verbal subscore is 5. GCS motor subscore is 6.  No obvious neurologic deficits.  CN intact.  Nose to finger intact.  Grip strength intact.  Fine movement and coordination intact.  Skin: Skin is warm. Capillary refill takes less than 2 seconds.  Psychiatric: She has a normal mood and affect.  Nursing note and vitals reviewed.    ED Treatments / Results  Labs (all labs ordered are listed, but only abnormal results are displayed) Labs Reviewed - No data to display  EKG None  Radiology No results found.  Procedures Procedures (including critical care time)  Medications Ordered in ED Medications - No data to display   Initial Impression / Assessment and Plan / ED Course  I have reviewed the triage vital signs and the nursing notes.  Pertinent labs & imaging results that were available during my care of the patient were reviewed by me and considered in my medical decision making (see chart for details).     Patient presenting for evaluation after a fall.  Physical exam reassuring, no obvious neurologic deficits.  However, patient reporting a lot of pain of her neck, reporting decrease sensation/tingling of her right arm.  As such, will attain CT C-spine for further evaluation.  Patient is reporting pain is greater than 10 of 10, although I am suspicious, as she has not tried anything.  Patient's back pain, radiating down her right leg is consistent with sciatica.  Patient states she does have a history of sciatica, although nothing that she is tried to treat this has worked.  Doubt spinal cord compression, vertebral injury, infection, cauda equina syndrome, or myelopathy.  I do not believe imaging of the back or hips is necessary at this time.  Patient without headache, or obvious neurologic symptoms.  Low suspicion for ICH, skull fracture, or SAH.  Toradol  for pain  On reassessment, patient is standing of her own volition without signs of pain.  Patient states her pain is much improved with the Toradol.  CT cervical pending.  Patient signed out to L. Layden, PA-C for follow-up on CT cervical.  If negative, discharge with NSAIDs and muscle relaxers.  Patient states she is able to take naproxen.  Per EGD report, no ulcers noted, although patient did have some gastritis.  Discussed with patient she is can continue to take her Carafate while taking naproxen, and stop if she has stomach upset.  Discussed use of muscle creams, heat, and stretching.  Follow-up with PCP as needed.     Final Clinical Impressions(s) / ED Diagnoses   Final diagnoses:  None    ED Discharge Orders    None       Franchot Heidelberg, PA-C 12/07/17 2237    Margette Fast, MD 12/08/17 [phone]

## 2017-12-07 NOTE — ED Provider Notes (Signed)
Care assumed from [facility], PA-C at shift change with CT cervical spine pending.   In brief, this patient is a 22 y.o. F who sustained a mechanical fall down stairs last night after tripping over a baby gate.  She reports landing on hardwood floor.  No head injury, LOC.  Comes in complaining of neck and back pain.  Patient also reports some tingling sensation to her hands.  She has been able to ambulate.  Please see note from previous provider for full history/physical exam.  PLAN: Patient with reassuring neuro exam.  No neuro deficit noted.  She is pending a CT cervical spine.  If unremarkable, discharged home with muscle relaxers and encourage supportive care measures.  MDM:  CT cervical spine shows no acute bony abnormality.  Discussed results with patient.  She reports improvement in pain after analgesics provided here in the ED. Patient had ample opportunity for questions and discussion. All patient's questions were answered with full understanding. Strict return precautions discussed. Patient expresses understanding and agreement to plan.    1. Fall, initial encounter   2. Muscle strain   3. Sciatica of right side   4. Acute right-sided back pain, unspecified back location         Karen Drake 12/07/17 2327    Charlesetta Shanks, MD 12/08/17 [phone]

## 2017-12-16 ENCOUNTER — Emergency Department (HOSPITAL_COMMUNITY): Payer: Self-pay

## 2017-12-16 ENCOUNTER — Emergency Department (HOSPITAL_COMMUNITY)
Admission: EM | Admit: 2017-12-16 | Discharge: 2017-12-16 | Disposition: A | Discharge status: Home / Self Care | Payer: Self-pay | Attending: Emergency Medicine | Admitting: Emergency Medicine

## 2017-12-16 ENCOUNTER — Encounter (HOSPITAL_COMMUNITY): Payer: Self-pay

## 2017-12-16 ENCOUNTER — Other Ambulatory Visit: Payer: Self-pay

## 2017-12-16 DIAGNOSIS — J189 Pneumonia, unspecified organism: Secondary | ICD-10-CM

## 2017-12-16 DIAGNOSIS — Z79899 Other long term (current) drug therapy: Secondary | ICD-10-CM | POA: Insufficient documentation

## 2017-12-16 DIAGNOSIS — J181 Lobar pneumonia, unspecified organism: Secondary | ICD-10-CM | POA: Insufficient documentation

## 2017-12-16 DIAGNOSIS — E109 Type 1 diabetes mellitus without complications: Secondary | ICD-10-CM | POA: Insufficient documentation

## 2017-12-16 DIAGNOSIS — F17210 Nicotine dependence, cigarettes, uncomplicated: Secondary | ICD-10-CM | POA: Insufficient documentation

## 2017-12-16 LAB — INFLUENZA PANEL BY PCR (TYPE A & B)
INFLBPCR: NEGATIVE
Influenza A By PCR: NEGATIVE

## 2017-12-16 MED ORDER — DOXYCYCLINE HYCLATE 100 MG PO CAPS: 100.0000 mg | ORAL_CAPSULE | Freq: Two times a day (BID) | ORAL | 0 refills | Status: DC

## 2017-12-16 MED ORDER — PREDNISONE 10 MG PO TABS: 40.0000 mg | ORAL_TABLET | Freq: Every day | ORAL | 0 refills | Status: DC

## 2017-12-16 MED ORDER — ALBUTEROL SULFATE HFA 108 (90 BASE) MCG/ACT IN AERS
2.0000 | INHALATION_SPRAY | RESPIRATORY_TRACT | Status: DC | PRN
  Administered 2017-12-16: 2 via RESPIRATORY_TRACT
  Filled 2017-12-16: qty 6.7

## 2017-12-16 MED ORDER — BENZONATATE 100 MG PO CAPS: 100.0000 mg | ORAL_CAPSULE | Freq: Three times a day (TID) | ORAL | 0 refills | Status: DC

## 2017-12-16 MED ORDER — PREDNISONE 20 MG PO TABS
60.0000 mg | ORAL_TABLET | Freq: Once | ORAL | Status: AC
  Administered 2017-12-16: 60 mg via ORAL
  Filled 2017-12-16: qty 3

## 2017-12-16 NOTE — ED Provider Notes (Signed)
[facility] DEPT Provider Note   CSN: [id number] Arrival date & time: 12/16/17  1212     History   Chief Complaint Chief Complaint  Patient presents with  . Cough    HPI Karen Drake is a 22 y.o. female with hx of DM, acid reflux, PCOS and thyroid disease and every day smoker who presents to the ED with a cough. The cough started yesterday. The cough is productive with yellow-green sputum. 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   The history is provided by the patient. No language interpreter was used.  URI   This is a new problem. The current episode started 12 to 24 hours ago. The problem has been gradually worsening. The maximum temperature recorded prior to her arrival was 100 to 100.9 F. Associated symptoms include congestion, ear pain, rhinorrhea and cough.    Past Medical History:  Diagnosis Date  . Acid reflux   . 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 Fatty liver   . Hiatal hernia   . Polycystic ovary syndrome   . Thyroid disease     There are no active problems to display for this patient.   Past Surgical History:  Procedure Laterality Date  . COLON SURGERY    . CYST REMOVAL LEG Right    posterior right knee     OB History   No obstetric history on file.      Home Medications    Prior to Admission medications   Medication Sig Start Date End Date Taking? Authorizing Provider  benzonatate (TESSALON) 100 MG capsule Take 1 capsule (100 mg total) by mouth every 8 (eight) hours. 12/16/17   Ashley Murrain, NP  doxycycline (VIBRAMYCIN) 100 MG capsule Take 1 capsule (100 mg total) by mouth 2 (two) times daily. 12/16/17   Ashley Murrain, NP  levothyroxine (SYNTHROID, LEVOTHROID) 25 MCG tablet Take 25 mcg by mouth daily. 10/20/17   [provider]  methocarbamol (ROBAXIN) 500 MG tablet Take 1 tablet (500 mg total) by mouth at  bedtime as needed for muscle spasms. 12/07/17   Caccavale, Sophia, PA-C  Multiple Vitamins-Minerals (MULTIVITAMIN GUMMIES ADULTS PO) Take 1 each by mouth daily.     [provider]  naproxen (NAPROSYN) 500 MG tablet Take 1 tablet (500 mg total) by mouth 2 (two) times daily as needed. Take with meals 12/07/17   Caccavale, Sophia, PA-C  predniSONE (DELTASONE) 10 MG tablet Take 4 tablets (40 mg total) by mouth daily with breakfast. Start this medication 12/17/17 12/16/17   Ashley Murrain, NP    Family History Family History  Problem Relation Age of Onset  . Cancer Other     Social History Social History   Tobacco Use  . Smoking status: Current Every Day Smoker    Packs/day: 0.25    Types: Cigarettes  . Smokeless tobacco: Never Used  Substance Use Topics  . Alcohol use: Never    Frequency: Never  . Drug use: Never     Allergies   Penicillins; Pineapple; Nsaids; and Tape   Review of Systems Review of Systems  HENT: Positive for congestion, ear pain and rhinorrhea.   Respiratory: Positive for cough.      Physical Exam Updated Vital Signs BP 133/85   Pulse (!) 119   Temp 98.7 F (37.1 C) (Oral)   Resp 20   Ht 5\' 7"  (1.702  m)   Wt 89.4 kg   LMP 12/03/2017   SpO2 99%   BMI 30.85 kg/m   Physical Exam Vitals signs and nursing note reviewed.  Constitutional:      General: She is not in acute distress.    Appearance: She is well-developed.  HENT:     Right Ear: Tympanic membrane normal.     Left Ear: Tympanic membrane normal.     Nose: Nose normal.     Mouth/Throat:     Mouth: Mucous membranes are moist.     Pharynx: Oropharynx is clear.  Neck:     Musculoskeletal: Neck supple.  Cardiovascular:     Rate and Rhythm: Tachycardia present.  Pulmonary:     Effort: Pulmonary effort is normal.     Breath sounds: Examination of the right-middle field reveals decreased breath sounds. Examination of the right-lower field reveals decreased breath sounds. Decreased  breath sounds present.  Abdominal:     Palpations: Abdomen is soft.     Tenderness: There is no abdominal tenderness.  Musculoskeletal: Normal range of motion.  Skin:    General: Skin is warm and dry.  Neurological:     Mental Status: She is alert and oriented to person, place, and time.     Cranial Nerves: No cranial nerve deficit.  Psychiatric:        Mood and Affect: Mood normal.      ED Treatments / Results  Labs (all labs ordered are listed, but only abnormal results are displayed) Labs Reviewed  INFLUENZA PANEL BY PCR (TYPE A & B)    Radiology Dg Chest 2 View  Result Date: 12/16/2017 CLINICAL DATA:  Productive cough with yellow/green sputum since yesterday, fever EXAM: CHEST - 2 VIEW COMPARISON:  None FINDINGS: Normal heart size, mediastinal contours, and pulmonary vascularity. Minimal peribronchial thickening. Question mild medial RIGHT basilar infiltrate. Remaining lungs clear. No pleural effusion or pneumothorax. Osseous structures unremarkable. IMPRESSION: Question mild RIGHT lower lobe infiltrate medially. Electronically Signed   By: Lavonia Dana M.D.   On: 12/16/2017 15:20    Procedures Procedures (including critical care time)  Medications Ordered in ED Medications  albuterol (PROVENTIL HFA;VENTOLIN HFA) 108 (90 Base) MCG/ACT inhaler 2 puff (has no administration in time range)  predniSONE (DELTASONE) tablet 60 mg (has no administration in time range)     Initial Impression / Assessment and Plan / ED Course  I have reviewed the triage vital signs and the nursing notes. 22 y.o. female here with cough and fever stable for d/c without respiratory distress and O2 SAT 99% on R/A. Discussed with the patient clinical and x-ray findings and plan of care. Patient appears stable for d/c in NAD. Will treat for CAP.   Final Clinical Impressions(s) / ED Diagnoses   Final diagnoses:  Community acquired pneumonia of right lower lobe of lung [facility])    ED Discharge Orders          Ordered    doxycycline (VIBRAMYCIN) 100 MG capsule  2 times daily     12/16/17 1533    benzonatate (TESSALON) 100 MG capsule  Every 8 hours     12/16/17 1533    predniSONE (DELTASONE) 10 MG tablet  Daily with breakfast     12/16/17 1533           Janit Bern [place], NP 12/16/17 Murdock, Kevin, MD 12/17/17 [phone]

## 2017-12-16 NOTE — Discharge Instructions (Addendum)
Take tylenol as needed for fever. Use the inhaler every 4 hours as needed. Return for worsening symptoms.

## 2017-12-16 NOTE — ED Triage Notes (Signed)
Patient c/o productive cough with yellow /green sputum since yesterday.

## 2017-12-16 NOTE — ED Notes (Signed)
Bed: WTR9 Expected date:  Expected time:  Means of arrival:  Comments: 

## 2017-12-24 ENCOUNTER — Encounter (HOSPITAL_COMMUNITY): Payer: Self-pay

## 2017-12-24 ENCOUNTER — Ambulatory Visit (HOSPITAL_COMMUNITY)
Admission: EM | Admit: 2017-12-24 | Discharge: 2017-12-24 | Disposition: A | Discharge status: Home / Self Care | Payer: Self-pay | Attending: Internal Medicine | Admitting: Internal Medicine

## 2017-12-24 ENCOUNTER — Ambulatory Visit (INDEPENDENT_AMBULATORY_CARE_PROVIDER_SITE_OTHER): Payer: Self-pay

## 2017-12-24 ENCOUNTER — Other Ambulatory Visit: Payer: Self-pay

## 2017-12-24 DIAGNOSIS — R05 Cough: Secondary | ICD-10-CM

## 2017-12-24 DIAGNOSIS — B9789 Other viral agents as the cause of diseases classified elsewhere: Secondary | ICD-10-CM | POA: Insufficient documentation

## 2017-12-24 DIAGNOSIS — J069 Acute upper respiratory infection, unspecified: Secondary | ICD-10-CM | POA: Insufficient documentation

## 2017-12-24 MED ORDER — IPRATROPIUM-ALBUTEROL 0.5-2.5 (3) MG/3ML IN SOLN
RESPIRATORY_TRACT | Status: AC
  Filled 2017-12-24: qty 3

## 2017-12-24 MED ORDER — HYDROCODONE-HOMATROPINE 5-1.5 MG/5ML PO SYRP: 5.0000 mL | ORAL_SOLUTION | Freq: Every day | ORAL | 0 refills | Status: DC

## 2017-12-24 MED ORDER — IPRATROPIUM-ALBUTEROL 0.5-2.5 (3) MG/3ML IN SOLN
3.0000 mL | Freq: Once | RESPIRATORY_TRACT | Status: AC
  Administered 2017-12-24: 3 mL via RESPIRATORY_TRACT

## 2017-12-24 NOTE — Discharge Instructions (Addendum)
Continue with antibiotic as prescribed.   Duo-neb given in office.   X-rays did not show a definitive pneumonia.   Get plenty of rest and push fluids Continue with tessalon Perles during the day Hycodan at night for severe symptoms Post nasal drainage may also be contributing to your symptoms begin taking an antihistamine like zyrtec or claritin daily for improvement in symptoms.   Follow up with PCP for recheck and/or if symptoms persists Return or go to ER if you have any new or worsening symptoms such as fever, chills, fatigue, shortness of breath, wheezing, chest pain, nausea, changes in bowel or bladder habits, etc..Marland Kitchen

## 2017-12-24 NOTE — ED Provider Notes (Signed)
[facility]   [id number] 12/24/17 Arrival Time: 1238  Cc: COUGH  SUBJECTIVE:  Rane Dumm is a 22 y.o. female who presents with persistent cough for the past week.  Was seen in the ED on 12/16/17 and diagnosed with PNA.  Treated with inhaler, doxycycline and prednisone.  Continues with dry cough and subjective fever.  Symptoms made worse at night.  Complains of subjective fever, chills, fatigue, mild rhinorrhea, and possible wheezes.  Denies sinus pain, sore throat, SOB, chest pain, nausea, changes in bowel or bladder habits.    ROS: As per HPI.  Past Medical History:  Diagnosis Date  . Acid reflux   . 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 Fatty liver   . Hiatal hernia   . Polycystic ovary syndrome   . Thyroid disease    Past Surgical History:  Procedure Laterality Date  . COLON SURGERY    . CYST REMOVAL LEG Right    posterior right knee   Allergies  Allergen Reactions  . Penicillins Anaphylaxis    Has patient had a PCN reaction causing immediate rash, facial/tongue/throat swelling, SOB or lightheadedness with hypotension: Yes Has patient had a PCN reaction causing severe rash involving mucus membranes or skin necrosis: No Has patient had a PCN reaction that required hospitalization: Yes Has patient had a PCN reaction occurring within the last 10 years: Yes If all of the above answers are "NO", then may proceed with Cephalosporin use.  Marland Kitchen Pineapple Anaphylaxis  . Nsaids     Due to gastritis and GERD  . Tape Rash    NO PAPER TAPE   No current facility-administered medications on file prior to encounter.    Current Outpatient Medications on File Prior to Encounter  Medication Sig Dispense Refill  . benzonatate (TESSALON) 100 MG capsule Take 1 capsule (100 mg total) by mouth every 8 (eight) hours. 21 capsule 0  . doxycycline (VIBRAMYCIN) 100 MG capsule Take 1 capsule (100 mg total) by mouth 2 (two) times daily. 20 capsule 0  .  levothyroxine (SYNTHROID, LEVOTHROID) 25 MCG tablet Take 25 mcg by mouth daily.  3  . methocarbamol (ROBAXIN) 500 MG tablet Take 1 tablet (500 mg total) by mouth at bedtime as needed for muscle spasms. 4 tablet 0  . Multiple Vitamins-Minerals (MULTIVITAMIN GUMMIES ADULTS PO) Take 1 each by mouth daily.     . naproxen (NAPROSYN) 500 MG tablet Take 1 tablet (500 mg total) by mouth 2 (two) times daily as needed. Take with meals 14 tablet 0  . predniSONE (DELTASONE) 10 MG tablet Take 4 tablets (40 mg total) by mouth daily with breakfast. Start this medication 12/17/17 12 tablet 0    Social History   Socioeconomic History  . Marital status: Single    Spouse name: Not on file  . Number of children: Not on file  . Years of education: Not on file  . Highest education level: Not on file  Occupational History  . Not on file  Social Needs  . Financial resource strain: Not on file  . Food insecurity:    Worry: Not on file    Inability: Not on file  . Transportation needs:    Medical: Not on file    Non-medical: Not on file  Tobacco Use  . Smoking status: Current Every Day Smoker    Packs/day: 0.25    Types: Cigarettes  . Smokeless tobacco: Never Used  Substance and Sexual Activity  . Alcohol use: Never  Frequency: Never  . Drug use: Never  . Sexual activity: Yes  Lifestyle  . Physical activity:    Days per week: Not on file    Minutes per session: Not on file  . Stress: Not on file  Relationships  . Social connections:    Talks on phone: Not on file    Gets together: Not on file    Attends religious service: Not on file    Active member of club or organization: Not on file    Attends meetings of clubs or organizations: Not on file    Relationship status: Not on file  . Intimate partner violence:    Fear of current or ex partner: Not on file    Emotionally abused: Not on file    Physically abused: Not on file    Forced sexual activity: Not on file  Other Topics Concern  .  Not on file  Social History Narrative  . Not on file   Family History  Problem Relation Age of Onset  . Cancer Other      OBJECTIVE:  Vitals:   12/24/17 1426 12/24/17 1427  BP:  124/70  Pulse:  71  Resp:  18  Temp:  97.8 F (36.6 C)  TempSrc:  Oral  SpO2:  98%  Weight: 192 lb (87.1 kg)      General appearance: Alert, appears fatigued, but nontoxic; speaking in full sentences without difficulty HEENT:NCAT; Ears: EACs clear, TMs pearly gray; Eyes: PERRL.  EOM grossly intact. Nose: nares patent with mild clear rhinorrhea; Throat: tonsils nonerythematous or enlarged, uvula midline  Neck: supple without LAD Lungs: CTAB; mild cough present; pt reports improvement in symptoms with duo-neb, CTAB Heart: regular rate and rhythm.  Radial pulses 2+ symmetrical bilaterally Skin: warm and dry Psychological: alert and cooperative; normal mood and affect  DIAGNOSTIC STUDIES:  Dg Chest 2 View  Result Date: 12/24/2017 CLINICAL DATA:  Recent pneumonia not getting better on antibiotics EXAM: CHEST - 2 VIEW COMPARISON:  Chest two-view 12/16/2017 FINDINGS: No definite pneumonia. Lungs appear clear without infiltrate or effusion. Cardiac and mediastinal contours are normal. Normal vascularity. IMPRESSION: No definite pneumonia. Electronically Signed   By: Franchot Gallo M.D.   On: 12/24/2017 15:09     ASSESSMENT & PLAN:  1. Viral URI with cough     Meds ordered this encounter  Medications  . ipratropium-albuterol (DUONEB) 0.5-2.5 (3) MG/3ML nebulizer solution 3 mL    Orders Placed This Encounter  Procedures  . DG Chest 2 View    Standing Status:   Standing    Number of Occurrences:   1    Order Specific Question:   Reason for Exam (SYMPTOM  OR DIAGNOSIS REQUIRED)    Answer:   Recently diagnosed with PNA, not getting better with doxy    Continue with antibiotic as prescribed.   Duo-neb given in office.   X-rays did not show a definitive pneumonia.   Get plenty of rest and push  fluids Continue with tessalon Perles during the day Hycodan at night for severe symptoms Post nasal drainage may also be contributing to your symptoms begin taking an antihistamine like zyrtec or Claritin daily for improvement in symptoms.   Follow up with PCP for recheck and/or if symptoms persists Return or go to ER if you have any new or worsening symptoms such as fever, chills, fatigue, shortness of breath, wheezing, chest pain, nausea, changes in bowel or bladder habits, etc...  Reviewed expectations re: course of current  medical issues. Questions answered. Outlined signs and symptoms indicating need for more acute intervention. Patient verbalized understanding. After Visit Summary given.          Lestine Box, PA-C 12/24/17 1538

## 2017-12-24 NOTE — ED Triage Notes (Signed)
Pt cc pt states she has been to the ER a week ago and was told she has pneumonia. Pt states her cough is tight and she's not sleeping.

## 2018-01-05 ENCOUNTER — Emergency Department (HOSPITAL_COMMUNITY)
Admission: EM | Admit: 2018-01-05 | Discharge: 2018-01-06 | Disposition: A | Discharge status: Home / Self Care | Payer: Self-pay | Attending: Emergency Medicine | Admitting: Emergency Medicine

## 2018-01-05 ENCOUNTER — Encounter (HOSPITAL_COMMUNITY): Payer: Self-pay

## 2018-01-05 DIAGNOSIS — Z79899 Other long term (current) drug therapy: Secondary | ICD-10-CM | POA: Insufficient documentation

## 2018-01-05 DIAGNOSIS — N939 Abnormal uterine and vaginal bleeding, unspecified: Secondary | ICD-10-CM

## 2018-01-05 DIAGNOSIS — F17210 Nicotine dependence, cigarettes, uncomplicated: Secondary | ICD-10-CM | POA: Insufficient documentation

## 2018-01-05 DIAGNOSIS — R102 Pelvic and perineal pain: Secondary | ICD-10-CM | POA: Insufficient documentation

## 2018-01-05 DIAGNOSIS — E119 Type 2 diabetes mellitus without complications: Secondary | ICD-10-CM | POA: Insufficient documentation

## 2018-01-05 LAB — URINALYSIS, ROUTINE W REFLEX MICROSCOPIC
Bilirubin Urine: NEGATIVE
GLUCOSE, UA: NEGATIVE mg/dL
Ketones, ur: NEGATIVE mg/dL
Leukocytes, UA: NEGATIVE
Nitrite: NEGATIVE
Protein, ur: NEGATIVE mg/dL
RBC / HPF: >50 RBC/hpf — ABNORMAL HIGH (ref 0–5)
Specific Gravity, Urine: 1.017 (ref 1.005–1.030)
pH: 6 (ref 5.0–8.0)

## 2018-01-05 LAB — CBC
HCT: 41.8 % (ref 36.0–46.0)
Hemoglobin: 13.2 g/dL (ref 12.0–15.0)
MCH: 28.8 pg (ref 26.0–34.0)
MCHC: 31.6 g/dL (ref 30.0–36.0)
MCV: 91.3 fL (ref 80.0–100.0)
Platelets: 296 10*3/uL (ref 150–400)
RBC: 4.58 MIL/uL (ref 3.87–5.11)
RDW: 12.6 % (ref 11.5–15.5)
WBC: 9.5 10*3/uL (ref 4.0–10.5)
nRBC: 0 % (ref 0.0–0.2)

## 2018-01-05 LAB — COMPREHENSIVE METABOLIC PANEL
ALK PHOS: 41 U/L (ref 38–126)
ALT: 27 U/L (ref 0–44)
AST: 47 U/L — ABNORMAL HIGH (ref 15–41)
Albumin: 4 g/dL (ref 3.5–5.0)
Anion gap: 8 (ref 5–15)
BUN: 10 mg/dL (ref 6–20)
CO2: 24 mmol/L (ref 22–32)
Calcium: 10 mg/dL (ref 8.9–10.3)
Chloride: 106 mmol/L (ref 98–111)
Creatinine, Ser: 0.87 mg/dL (ref 0.44–1.00)
GFR calc Af Amer: >60 mL/min (ref >60)
GFR calc non Af Amer: >60 mL/min (ref >60)
Glucose, Bld: 103 mg/dL — ABNORMAL HIGH (ref 70–99)
Potassium: 4.5 mmol/L (ref 3.5–5.1)
Sodium: 138 mmol/L (ref 135–145)
Total Bilirubin: 0.6 mg/dL (ref 0.3–1.2)
Total Protein: 7.3 g/dL (ref 6.5–8.1)

## 2018-01-05 LAB — LIPASE, BLOOD: Lipase: 42 U/L (ref 11–51)

## 2018-01-05 LAB — I-STAT BETA HCG BLOOD, ED (MC, WL, AP ONLY): I-stat hCG, quantitative: 10.9 m[IU]/mL — ABNORMAL HIGH (ref <5)

## 2018-01-05 NOTE — ED Triage Notes (Signed)
Pt reports she ended her menstrual cycle on new years day and now she is having continued vaginal bleeding today, at first it was a sudden gush of blood and now it is light bleeding. Pt has hx of PCOS and was told she could have fibroids

## 2018-01-06 ENCOUNTER — Emergency Department (HOSPITAL_COMMUNITY): Payer: Self-pay

## 2018-01-06 LAB — WET PREP, GENITAL
SPERM: NONE SEEN
Trich, Wet Prep: NONE SEEN
Yeast Wet Prep HPF POC: NONE SEEN

## 2018-01-06 LAB — POC URINE PREG, ED: Preg Test, Ur: NEGATIVE

## 2018-01-06 MED ORDER — ACETAMINOPHEN 325 MG PO TABS
650.0000 mg | ORAL_TABLET | Freq: Once | ORAL | Status: AC
  Administered 2018-01-06: 650 mg via ORAL
  Filled 2018-01-06: qty 2

## 2018-01-06 NOTE — ED Notes (Signed)
Pt returned from  [facility]

## 2018-01-06 NOTE — ED Provider Notes (Signed)
[facility] EMERGENCY DEPARTMENT Provider Note   CSN: [id number] Arrival date & time: 01/05/18  1540     History   Chief Complaint Chief Complaint  Patient presents with  . Vaginal Bleeding  . Abdominal Pain    HPI Karen Drake is a 23 y.o. female with a history of PCOS, hiatal hernia, thyroid disease, pre-diabetes mellitus, and GERD who presents to the emergency department with a chief complaint of vaginal bleeding.  The patient endorses sudden onset vaginal bleeding, onset today.  Reports that she initially felt a large gush of blood.  Reports that the last few times that she is avoided that she is only noted blood on the toilet tissue with wiping.  She reports a history of chronic pelvic pain, but reports worsening pain on her left side since bleeding began today.  She denies fever, chills, nausea, vomiting, diarrhea, back pain, dysuria, urinary frequency or hesitancy, or vaginal discharge.   She reports that she is concerned after she had a menstrual cycle that lasted for 2 weeks.  States previous vaginal bleeding stopped on 01/02/18.  During that time, reports that she went through about 2 packs of super maxi pads that contained about 48 pads.  Reports that she is approximately 10-15 pads per day.   She reports a history of irregular menstrual cycles secondary to PCOS.  She is on OCPs.  She is sexually active with one female partner.  Reports that she has taken numerous pregnancy tests at home that have all been negative.   The history is provided by the patient. No language interpreter was used.    Past Medical History:  Diagnosis Date  . Acid reflux   . 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 Fatty liver   . Hiatal hernia   . Polycystic ovary syndrome   . Thyroid disease     There are no active problems to display for this patient.   Past Surgical History:  Procedure Laterality Date  . COLON SURGERY    . CYST REMOVAL LEG  Right    posterior right knee     OB History   No obstetric history on file.      Home Medications    Prior to Admission medications   Medication Sig Start Date End Date Taking? Authorizing Provider  levothyroxine (SYNTHROID, LEVOTHROID) 25 MCG tablet Take 25 mcg by mouth daily. 10/20/17  Yes [provider]  Multiple Vitamins-Minerals (MULTIVITAMIN GUMMIES ADULTS PO) Take 1 each by mouth daily.    Yes [provider]    Family History Family History  Problem Relation Age of Onset  . Cancer Other     Social History Social History   Tobacco Use  . Smoking status: Current Every Day Smoker    Packs/day: 0.25    Types: Cigarettes  . Smokeless tobacco: Never Used  Substance Use Topics  . Alcohol use: Never    Frequency: Never  . Drug use: Never     Allergies   Penicillins; Pineapple; Nsaids; and Tape   Review of Systems Review of Systems  Constitutional: Negative for activity change, chills and fever.  Respiratory: Negative for shortness of breath.   Cardiovascular: Negative for chest pain.  Gastrointestinal: Positive for abdominal pain. Negative for diarrhea, nausea and vomiting.  Genitourinary: Positive for menstrual problem, pelvic pain and vaginal bleeding. Negative for dysuria, frequency, hematuria and urgency.  Musculoskeletal: Negative for back pain, neck pain and neck stiffness.  Skin: Negative  for rash.  Allergic/Immunologic: Negative for immunocompromised state.  Neurological: Negative for tremors, seizures, weakness, numbness and headaches.  Psychiatric/Behavioral: Negative for confusion.     Physical Exam Updated Vital Signs BP (!) 96/56   Pulse 97   Temp 98.5 F (36.9 C) (Oral)   Resp 16   SpO2 98%   Physical Exam Vitals signs and nursing note reviewed.  Constitutional:      General: She is not in acute distress.    Appearance: She is not ill-appearing or toxic-appearing.     Comments: Hirsutism  HENT:     Head:  Normocephalic.  Eyes:     Conjunctiva/sclera: Conjunctivae normal.  Neck:     Musculoskeletal: Neck supple.  Cardiovascular:     Rate and Rhythm: Normal rate and regular rhythm.     Heart sounds: No murmur. No friction rub. No gallop.   Pulmonary:     Effort: Pulmonary effort is normal. No respiratory distress.     Breath sounds: No stridor. No wheezing, rhonchi or rales.  Abdominal:     General: Bowel sounds are normal. There is no distension.     Palpations: Abdomen is soft.     Tenderness: There is abdominal tenderness in the left lower quadrant. There is no right CVA tenderness, left CVA tenderness, guarding or rebound. Negative signs include Murphy's sign, Rovsing's sign and McBurney's sign.     Hernia: No hernia is present.     Comments: Maximal tenderness to palpation in the inferior left lower quadrant with mild tenderness in the suprapubic and right lower quadrant.  Abdomen is soft, nondistended.  No rebound or guarding.  No CVA tenderness bilaterally.  No tenderness over McBurney's point.  Upper abdomen is unremarkable.  Skin:    General: Skin is warm.     Findings: No rash.  Neurological:     Mental Status: She is alert.  Psychiatric:        Behavior: Behavior normal.      ED Treatments / Results  Labs (all labs ordered are listed, but only abnormal results are displayed) Labs Reviewed  WET PREP, GENITAL - Abnormal; Notable for the following components:      Result Value   Clue Cells Wet Prep HPF POC PRESENT (*)    WBC, Wet Prep HPF POC MANY (*)    All other components within normal limits  COMPREHENSIVE METABOLIC PANEL - Abnormal; Notable for the following components:   Glucose, Bld 103 (*)    AST 47 (*)    All other components within normal limits  URINALYSIS, ROUTINE W REFLEX MICROSCOPIC - Abnormal; Notable for the following components:   APPearance HAZY (*)    Hgb urine dipstick LARGE (*)    RBC / HPF >50 (*)    Bacteria, UA RARE (*)    All other  components within normal limits  I-STAT BETA HCG BLOOD, ED ([facility], [facility], [facility] ONLY) - Abnormal; Notable for the following components:   I-stat hCG, quantitative 10.9 (*)    All other components within normal limits  LIPASE, BLOOD  CBC  POC URINE PREG, ED  GC/CHLAMYDIA PROBE AMP ([place]) NOT AT [facility]    EKG None  Radiology US Transvaginal Non-ob  Result Date: 01/06/2018 CLINICAL DATA:  Left pelvic tenderness for 1 day. EXAM: TRANSABDOMINAL AND TRANSVAGINAL ULTRASOUND OF PELVIS DOPPLER ULTRASOUND OF OVARIES TECHNIQUE: Both transabdominal and transvaginal ultrasound examinations of the pelvis were performed. Transabdominal technique was performed for global imaging of the pelvis including uterus, ovaries,  adnexal regions, and pelvic cul-de-sac. It was necessary to proceed with endovaginal exam following the transabdominal exam to visualize the uterus, ovaries, and adnexa. Color and duplex Doppler ultrasound was utilized to evaluate blood flow to the ovaries. COMPARISON:  None. FINDINGS: Uterus Measurements: 6.9 x 3.8 x 5.7 cm = volume: 68 mL. No fibroids or other mass visualized. Nabothian cyst in the cervix. Endometrium Thickness: 9 mm.  No focal abnormality visualized. Right ovary Measurements: 2.7 x 2.4 x 2.7 cm = volume: 8.9 mL. Multiple follicles, many peripherally distributed, uniform in size. Normal blood flow. No adnexal mass. Left ovary Measurements: 3.3 x 2.3 x 2.7 cm = volume: 10.7 mL. Multiple follicles, uniform in size in many peripherally distributed. Normal blood flow. No adnexal mass. Pulsed Doppler evaluation of both ovaries demonstrates normal low-resistance arterial and venous waveforms. Other findings No abnormal free fluid. IMPRESSION: 1. Normal blood flow to both ovaries without torsion. 2. Sonographic appearance the ovaries with multiple follicles and left ovarian volume of greater than 10 cc can be seen with polycystic ovarian syndrome. Electronically Signed   By: Keith Rake  M.D.   On: 01/06/2018 02:20   US Pelvis Complete  Result Date: 01/06/2018 CLINICAL DATA:  Left pelvic tenderness for 1 day. EXAM: TRANSABDOMINAL AND TRANSVAGINAL ULTRASOUND OF PELVIS DOPPLER ULTRASOUND OF OVARIES TECHNIQUE: Both transabdominal and transvaginal ultrasound examinations of the pelvis were performed. Transabdominal technique was performed for global imaging of the pelvis including uterus, ovaries, adnexal regions, and pelvic cul-de-sac. It was necessary to proceed with endovaginal exam following the transabdominal exam to visualize the uterus, ovaries, and adnexa. Color and duplex Doppler ultrasound was utilized to evaluate blood flow to the ovaries. COMPARISON:  None. FINDINGS: Uterus Measurements: 6.9 x 3.8 x 5.7 cm = volume: 68 mL. No fibroids or other mass visualized. Nabothian cyst in the cervix. Endometrium Thickness: 9 mm.  No focal abnormality visualized. Right ovary Measurements: 2.7 x 2.4 x 2.7 cm = volume: 8.9 mL. Multiple follicles, many peripherally distributed, uniform in size. Normal blood flow. No adnexal mass. Left ovary Measurements: 3.3 x 2.3 x 2.7 cm = volume: 10.7 mL. Multiple follicles, uniform in size in many peripherally distributed. Normal blood flow. No adnexal mass. Pulsed Doppler evaluation of both ovaries demonstrates normal low-resistance arterial and venous waveforms. Other findings No abnormal free fluid. IMPRESSION: 1. Normal blood flow to both ovaries without torsion. 2. Sonographic appearance the ovaries with multiple follicles and left ovarian volume of greater than 10 cc can be seen with polycystic ovarian syndrome. Electronically Signed   By: Keith Rake M.D.   On: 01/06/2018 02:20   [place] Art/ven Flow Abd Pelv Doppler  Result Date: 01/06/2018 CLINICAL DATA:  Left pelvic tenderness for 1 day. EXAM: TRANSABDOMINAL AND TRANSVAGINAL ULTRASOUND OF PELVIS DOPPLER ULTRASOUND OF OVARIES TECHNIQUE: Both transabdominal and transvaginal ultrasound examinations of the  pelvis were performed. Transabdominal technique was performed for global imaging of the pelvis including uterus, ovaries, adnexal regions, and pelvic cul-de-sac. It was necessary to proceed with endovaginal exam following the transabdominal exam to visualize the uterus, ovaries, and adnexa. Color and duplex Doppler ultrasound was utilized to evaluate blood flow to the ovaries. COMPARISON:  None. FINDINGS: Uterus Measurements: 6.9 x 3.8 x 5.7 cm = volume: 68 mL. No fibroids or other mass visualized. Nabothian cyst in the cervix. Endometrium Thickness: 9 mm.  No focal abnormality visualized. Right ovary Measurements: 2.7 x 2.4 x 2.7 cm = volume: 8.9 mL. Multiple follicles, many peripherally distributed, uniform in size.  Normal blood flow. No adnexal mass. Left ovary Measurements: 3.3 x 2.3 x 2.7 cm = volume: 10.7 mL. Multiple follicles, uniform in size in many peripherally distributed. Normal blood flow. No adnexal mass. Pulsed Doppler evaluation of both ovaries demonstrates normal low-resistance arterial and venous waveforms. Other findings No abnormal free fluid. IMPRESSION: 1. Normal blood flow to both ovaries without torsion. 2. Sonographic appearance the ovaries with multiple follicles and left ovarian volume of greater than 10 cc can be seen with polycystic ovarian syndrome. Electronically Signed   By: Keith Rake M.D.   On: 01/06/2018 02:20    Procedures Procedures (including critical care time)  Medications Ordered in ED Medications  acetaminophen (TYLENOL) tablet 650 mg (650 mg Oral Given 01/06/18 0125)     Initial Impression / Assessment and Plan / ED Course  I have reviewed the triage vital signs and the nursing notes.  Pertinent labs & imaging results that were available during my care of the patient were reviewed by me and considered in my medical decision making (see chart for details).     23 year old female with a history of PCOS, hiatal hernia, thyroid disease, pre-diabetes  mellitus, and GERD presenting with vaginal bleeding, onset today.  Reports her last menstrual cycle was for 2 weeks and stopped 3 days ago.  Reports vaginal bleeding today is progressively improved since onset.  She has a known history of PCOS. On exam, she is tender to palpation in the left lower abdomen, pelvic regions.  No rebound or guarding.  Differential diagnosis includes ovarian torsion, ruptured hemorrhagic cyst, anovulation, ectopic pregnancy, or threatened abortion.  Tylenol given for pain control ED.  I-STAT beta-hCG is 10.9.  I suspect this is a false positive as urine pregnancy test is negative.  Hemoglobin is 13.2.  Pelvic ultrasound with normal blood flow to the bilateral ovaries without torsion.  Multiple follicles and left ovarian volume of greater than 10 cc, consistent with PCOS.  GC chlamydia is pending.  Wet prep with clue cells; however, the patient has remained asymptomatic and is not endorsing any vaginal discharge. Will defer treatment at this time.   Discussed these findings with the patient.  Recommended follow-up with OB/GYN in the clinic.  Strict return precautions given.  Patient is hemodynamically stable and in no acute distress.  Safe for discharge home with outpatient follow-up at this time.  Final Clinical Impressions(s) / ED Diagnoses   Final diagnoses:  Pelvic pain in female  Vaginal bleeding    ED Discharge Orders    None       Joanne Gavel, PA-C 85/92/92 [id number]    Delora Fuel, MD 86/38/17 [phone]

## 2018-01-06 NOTE — ED Notes (Signed)
To ultrasound

## 2018-01-06 NOTE — Discharge Instructions (Signed)
Thank you for allowing me to care for you today in the Emergency Department.   Follow-up with your OB/GYN regarding your ER visit today.  You can take 600 mg of ibuprofen with food or 650 mg of Tylenol once every 6 hours for pain control.  You can also apply heat or warm compresses for 15 to 20 minutes as frequently as needed to help with pain.  Return to the emergency department if you develop significant shortness of breath, uncontrollable abdominal pain, high fever, persistent vomiting, if your bleeding significantly worsens and you pass out or become very short of breath, or other new, concerning symptoms.

## 2018-01-08 LAB — GC/CHLAMYDIA PROBE AMP (~~LOC~~) NOT AT ARMC
Chlamydia: NEGATIVE
Neisseria Gonorrhea: NEGATIVE

## 2018-01-15 ENCOUNTER — Ambulatory Visit (INDEPENDENT_AMBULATORY_CARE_PROVIDER_SITE_OTHER): Payer: Self-pay | Admitting: General Practice

## 2018-01-15 DIAGNOSIS — E349 Endocrine disorder, unspecified: Secondary | ICD-10-CM

## 2018-01-15 NOTE — Progress Notes (Signed)
Patient presents to office today for bhcg following recent ER visit. Patient reports she is continuing to bleed like a period with menstrual cramps. Patient reports history of PCOS. Discussed with patient drawing routine bhcg level and results will be back by tomorrow but stat bhcg not indicated per Dr Ilda Basset due to history and low serum bhcg. Patient verbalized understanding.  Koren Bound RN BSN 01/15/18

## 2018-01-16 LAB — BETA HCG QUANT (REF LAB): hCG Quant: <1 m[IU]/mL

## 2018-01-16 NOTE — Progress Notes (Signed)
I have reviewed the chart and agree with nursing staff's documentation of this patient's encounter.  Aletha Halim, MD 01/16/2018 11:09 AM

## 2018-01-24 ENCOUNTER — Other Ambulatory Visit: Payer: Self-pay

## 2018-01-24 ENCOUNTER — Encounter (HOSPITAL_COMMUNITY): Payer: Self-pay

## 2018-01-24 DIAGNOSIS — R112 Nausea with vomiting, unspecified: Secondary | ICD-10-CM | POA: Insufficient documentation

## 2018-01-24 DIAGNOSIS — R1084 Generalized abdominal pain: Secondary | ICD-10-CM | POA: Insufficient documentation

## 2018-01-24 DIAGNOSIS — N939 Abnormal uterine and vaginal bleeding, unspecified: Secondary | ICD-10-CM | POA: Insufficient documentation

## 2018-01-24 DIAGNOSIS — Z79899 Other long term (current) drug therapy: Secondary | ICD-10-CM | POA: Insufficient documentation

## 2018-01-24 DIAGNOSIS — F17210 Nicotine dependence, cigarettes, uncomplicated: Secondary | ICD-10-CM | POA: Insufficient documentation

## 2018-01-24 LAB — CBC
HCT: 39.8 % (ref 36.0–46.0)
Hemoglobin: 12.8 g/dL (ref 12.0–15.0)
MCH: 30.5 pg (ref 26.0–34.0)
MCHC: 32.2 g/dL (ref 30.0–36.0)
MCV: 94.8 fL (ref 80.0–100.0)
Platelets: 313 10*3/uL (ref 150–400)
RBC: 4.2 MIL/uL (ref 3.87–5.11)
RDW: 13.1 % (ref 11.5–15.5)
WBC: 10.7 10*3/uL — ABNORMAL HIGH (ref 4.0–10.5)
nRBC: 0 % (ref 0.0–0.2)

## 2018-01-24 LAB — COMPREHENSIVE METABOLIC PANEL
ALT: 49 U/L — ABNORMAL HIGH (ref 0–44)
AST: 76 U/L — AB (ref 15–41)
Albumin: 4.1 g/dL (ref 3.5–5.0)
Alkaline Phosphatase: 36 U/L — ABNORMAL LOW (ref 38–126)
Anion gap: 7 (ref 5–15)
BUN: 8 mg/dL (ref 6–20)
CO2: 26 mmol/L (ref 22–32)
Calcium: 9.3 mg/dL (ref 8.9–10.3)
Chloride: 108 mmol/L (ref 98–111)
Creatinine, Ser: 0.94 mg/dL (ref 0.44–1.00)
GFR calc Af Amer: >60 mL/min (ref >60)
GFR calc non Af Amer: >60 mL/min (ref >60)
Glucose, Bld: 106 mg/dL — ABNORMAL HIGH (ref 70–99)
Potassium: 4 mmol/L (ref 3.5–5.1)
Sodium: 141 mmol/L (ref 135–145)
Total Bilirubin: 0.5 mg/dL (ref 0.3–1.2)
Total Protein: 6.8 g/dL (ref 6.5–8.1)

## 2018-01-24 LAB — I-STAT BETA HCG BLOOD, ED (MC, WL, AP ONLY): I-stat hCG, quantitative: <5 m[IU]/mL (ref <5)

## 2018-01-24 LAB — LIPASE, BLOOD: Lipase: 50 U/L (ref 11–51)

## 2018-01-24 MED ORDER — SODIUM CHLORIDE 0.9% FLUSH: 3.0000 mL | Freq: Once | INTRAVENOUS | Status: DC

## 2018-01-24 NOTE — ED Triage Notes (Signed)
Pt states that she was at [facility] on 01/05/18 and was told that she had a cyst that ruptured causing vaginal bleeding. Pt states that she has been having vaginal bleeding with intermittent clots since. Pt states that she has also had abdominal pain since but yesterday the pain was so bad that she started vomiting. Pt has hx of PCOS.

## 2018-01-25 ENCOUNTER — Emergency Department (HOSPITAL_COMMUNITY): Payer: Self-pay

## 2018-01-25 ENCOUNTER — Encounter (HOSPITAL_COMMUNITY): Payer: Self-pay

## 2018-01-25 ENCOUNTER — Emergency Department (HOSPITAL_COMMUNITY)
Admission: EM | Admit: 2018-01-25 | Discharge: 2018-01-25 | Disposition: A | Discharge status: Home / Self Care | Payer: Self-pay | Attending: Emergency Medicine | Admitting: Emergency Medicine

## 2018-01-25 DIAGNOSIS — N939 Abnormal uterine and vaginal bleeding, unspecified: Secondary | ICD-10-CM

## 2018-01-25 LAB — URINALYSIS, ROUTINE W REFLEX MICROSCOPIC
Bilirubin Urine: NEGATIVE
Glucose, UA: NEGATIVE mg/dL
Ketones, ur: NEGATIVE mg/dL
Leukocytes, UA: NEGATIVE
NITRITE: NEGATIVE
Protein, ur: NEGATIVE mg/dL
Specific Gravity, Urine: 1.02 (ref 1.005–1.030)
pH: 6 (ref 5.0–8.0)

## 2018-01-25 MED ORDER — ONDANSETRON HCL 4 MG PO TABS: 4.0000 mg | ORAL_TABLET | Freq: Four times a day (QID) | ORAL | 0 refills | Status: AC

## 2018-01-25 MED ORDER — ONDANSETRON 4 MG PO TBDP
4.0000 mg | ORAL_TABLET | Freq: Once | ORAL | Status: AC
  Administered 2018-01-25: 4 mg via ORAL
  Filled 2018-01-25: qty 1

## 2018-01-25 MED ORDER — ACETAMINOPHEN 500 MG PO TABS
1000.0000 mg | ORAL_TABLET | Freq: Once | ORAL | Status: AC
  Administered 2018-01-25: 1000 mg via ORAL
  Filled 2018-01-25: qty 2

## 2018-01-25 NOTE — ED Provider Notes (Signed)
[facility] DEPT Provider Note   CSN: [id number] Arrival date & time: 01/24/18  2219     History   Chief Complaint Chief Complaint  Patient presents with  . Vaginal Bleeding  . Abdominal Pain  . Emesis    HPI Brynleigh Sequeira is a 23 y.o. female with history of borderline diabetes mellitus, fatty liver, PCOS, and thyroid disease who presents to the emergency department with vomiting.  The patient endorses 2 episodes of nonbloody, nonbilious vomiting 2 days ago and one episode of vomiting in the last 24 hours with associated abdominal pain, and vaginal bleeding.  She states that she felt that she started vomiting because the pain from the vaginal bleeding was so severe.  She reports she previously had a period that went from December 16-31, but then returned on December 3.  She was seen in the ER at [facility] on January 05, 2018 and it was suspected that she may have had a ruptured ovarian cyst that was causing her bleeding at that time.  She was advised to follow-up with OB/GYN given her history of PCOS and she reports that she is called the clinic, but has yet to schedule an appointment.  She reports that she has been going through pack of pads every 2 days, but reports that she stopped wearing the pads because they were uncomfortable and has bled through her underwear and close since she has not been wearing tampons or pads.  She states that she has passed several blood clots and is concerned that she may have had some blood in her urine, which she noticed yesterday when she went to the bathroom and is concerned she may have an infection.  She denies fever, chills, back pain, dysuria, urinary frequency or hesitancy, vaginal discharge, chest pain, shortness of breath, rectal pain, melena, hematochezia.  She has been treating her symptoms at home with Tylenol and heating pad with minimal improvement of the cramping abdominal pain.  The history is provided by the  patient. No language interpreter was used.    Past Medical History:  Diagnosis Date  . Acid reflux   . 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 Fatty liver   . Hiatal hernia   . Polycystic ovary syndrome   . Thyroid disease     There are no active problems to display for this patient.   Past Surgical History:  Procedure Laterality Date  . COLON SURGERY    . CYST REMOVAL LEG Right    posterior right knee     OB History   No obstetric history on file.      Home Medications    Prior to Admission medications   Medication Sig Start Date End Date Taking? Authorizing Provider  acetaminophen (TYLENOL) 500 MG tablet Take 1,000 mg by mouth every 6 (six) hours as needed for mild pain.   Yes [provider]  levothyroxine (SYNTHROID, LEVOTHROID) 25 MCG tablet Take 25 mcg by mouth daily. 10/20/17  Yes [provider]  Multiple Vitamins-Minerals (MULTIVITAMIN GUMMIES ADULTS PO) Take 1 each by mouth daily.    Yes [provider]  ondansetron (ZOFRAN) 4 MG tablet Take 1 tablet (4 mg total) by mouth every 6 (six) hours. 01/25/18   Josmar Messimer A, PA-C    Family History Family History  Problem Relation Age of Onset  . Cancer Other     Social History Social History   Tobacco Use  . Smoking status: Current  Every Day Smoker    Packs/day: 0.25    Types: Cigarettes  . Smokeless tobacco: Never Used  Substance Use Topics  . Alcohol use: Never    Frequency: Never  . Drug use: Never     Allergies   Penicillins; Pineapple; Nsaids; and Tape   Review of Systems Review of Systems  Constitutional: Negative for activity change, chills and fever.  HENT: Negative for congestion, sinus pressure and sinus pain.   Respiratory: Negative for choking and shortness of breath.   Cardiovascular: Negative for chest pain.  Gastrointestinal: Positive for abdominal pain, nausea and vomiting. Negative for anal bleeding, blood in stool,  constipation and diarrhea.  Genitourinary: Positive for vaginal bleeding. Negative for dysuria, flank pain, frequency, menstrual problem, urgency, vaginal discharge and vaginal pain.  Musculoskeletal: Negative for back pain, myalgias and neck pain.  Skin: Negative for rash.  Allergic/Immunologic: Negative for immunocompromised state.  Neurological: Negative for syncope, speech difficulty, weakness, numbness and headaches.  Psychiatric/Behavioral: Negative for confusion.     Physical Exam Updated Vital Signs BP 103/65   Pulse 90   Temp 97.9 F (36.6 C)   Resp 14   Ht _0  (1.702 m)   Wt 90.7 kg   LMP 12/18/2017 Comment: negative beta HCG 01/24/18  SpO2 99%   BMI 31.32 kg/m   Physical Exam Vitals signs and nursing note reviewed.  Constitutional:      General: She is not in acute distress. HENT:     Head: Normocephalic.  Eyes:     Conjunctiva/sclera: Conjunctivae normal.  Neck:     Musculoskeletal: Neck supple.  Cardiovascular:     Rate and Rhythm: Normal rate and regular rhythm.     Heart sounds: No murmur. No friction rub. No gallop.   Pulmonary:     Effort: Pulmonary effort is normal. No respiratory distress.     Breath sounds: No stridor. No wheezing, rhonchi or rales.  Chest:     Chest wall: No tenderness.  Abdominal:     General: A surgical scar is present. Bowel sounds are normal. There is no distension.     Palpations: Abdomen is soft.     Tenderness: There is generalized abdominal tenderness. There is right CVA tenderness. There is no left CVA tenderness, guarding or rebound. Negative signs include Murphy's sign, Rovsing's sign and McBurney's sign.     Hernia: No hernia is present.     Comments: Diffusely tender to palpation throughout the abdomen,, lower greater than upper.  No rebound or guarding.  Skin:    General: Skin is warm.     Findings: No rash.  Neurological:     Mental Status: She is alert.  Psychiatric:        Behavior: Behavior normal.    ED  Treatments / Results  Labs (all labs ordered are listed, but only abnormal results are displayed) Labs Reviewed  COMPREHENSIVE METABOLIC PANEL - Abnormal; Notable for the following components:      Result Value   Glucose, Bld 106 (*)    AST 76 (*)    ALT 49 (*)    Alkaline Phosphatase 36 (*)    All other components within normal limits  CBC - Abnormal; Notable for the following components:   WBC 10.7 (*)    All other components within normal limits  URINALYSIS, ROUTINE W REFLEX MICROSCOPIC - Abnormal; Notable for the following components:   APPearance HAZY (*)    Hgb urine dipstick LARGE (*)    Bacteria, UA  RARE (*)    All other components within normal limits  LIPASE, BLOOD  I-STAT BETA HCG BLOOD, ED ([facility], [facility], [facility] ONLY)    EKG None  Radiology Ct Renal Stone Study  Result Date: 01/25/2018 CLINICAL DATA:  Flank pain with stone disease suspected EXAM: CT ABDOMEN AND PELVIS WITHOUT CONTRAST TECHNIQUE: Multidetector CT imaging of the abdomen and pelvis was performed following the standard protocol without IV contrast. COMPARISON:  None. FINDINGS: Lower chest:  No contributory findings. Hepatobiliary: Hepatic steatosis.No evidence of biliary obstruction or stone. Pancreas: Unremarkable. Spleen: Unremarkable. Adrenals/Urinary Tract: Negative adrenals. No hydronephrosis or stone. Unremarkable bladder. 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 No obstruction or inflammatory changes. No appendicitis. Vascular/Lymphatic: No detected vascular abnormality. No mass or adenopathy. Reproductive:Unremarkable. Other: No ascites or pneumoperitoneum. Musculoskeletal: No acute abnormalities. IMPRESSION: 1. No acute finding or explanation for pain. 2. Total colectomy with ileal pouch. 3. Hepatic steatosis. Electronically Signed   By: Monte Fantasia M.D.   On: 01/25/2018 05:08    Procedures Procedures (including critical care  time)  Medications Ordered in ED Medications  sodium chloride flush (NS) 0.9 % injection 3 mL (3 mLs Intravenous Not Given 01/25/18 0452)  acetaminophen (TYLENOL) tablet 1,000 mg (1,000 mg Oral Given 01/25/18 0510)  ondansetron (ZOFRAN-ODT) disintegrating tablet 4 mg (4 mg Oral Given 01/25/18 4193)     Initial Impression / Assessment and Plan / ED Course  I have reviewed the triage vital signs and the nursing notes.  Pertinent labs & imaging results that were available during my care of the patient were reviewed by me and considered in my medical decision making (see chart for details).     23 year old female with history of borderline diabetes mellitus, fatty liver, PCOS, and thyroid disease who presents to the emergency department with a chief complaint of vomiting x2 days, nausea, abdominal pain, vaginal bleeding.  The patient was seen, by me, in the emergency department on January 05, 2018 and there was concern for ruptured ovarian cyst at that time as she had sudden onset pain with vaginal bleeding several days after finishing a prolonged menstrual cycle for most of the month of December.  She has called her OB/GYN, but has not been seen for an appointment in the clinic.  On exam today, she has a right CVA tenderness and mild generalized tenderness throughout the abdomen.  No focal findings.  She does not have a surgical abdomen.  Labs are notable for AST of 76 and ALT of 49, but transaminases have been elevated in the past and the patient does have a diagnosis of fatty liver.  Alk phos, lipase, and total bilirubin are unremarkable.  Hemoglobin is stable at 12.8.  UA with RBCs and hemoglobinuria.  Although the urine was a cath, it is expected this may be from vaginal bleeding, but in the setting of right CVA tenderness, will order CT stone study to ensure the patient's pain is not secondary to calculus.  CT stone study is negative.  She was given Tylenol in the ED for pain control.  I had a  lengthy shared decision making conversation with the patient and her significant other.  Discussed that abnormal uterine bleeding can occur with patients with PCOS.  Of advised her that she needs to follow-up with OB/GYN for continued pain or vaginal bleeding.  We discussed return precautions to the emergency department, including signs and symptoms of symptomatic anemia, she had a positive pregnancy test,  as well as others.  The patient and her significant other acknowledged this conversation.  All questions were answered.  Will discharge with Zofran and recommended Tylenol for continued pain control.  Strict return precautions given.  Patient is hemodynamically stable and in no acute distress.  Safe for discharge home with outpatient follow-up at this time.  Final Clinical Impressions(s) / ED Diagnoses   Final diagnoses:  Abnormal uterine bleeding    ED Discharge Orders         Ordered    ondansetron (ZOFRAN) 4 MG tablet  Every 6 hours     01/25/18 0529           Joline Maxcy A, PA-C 01/25/18 2902    Varney Biles, MD 01/26/18 [phone]

## 2018-01-25 NOTE — Discharge Instructions (Addendum)
Thank you for allowing me to care for you today in the Emergency Department.   Call tomorrow to schedule a follow up appointment with your OBGYN regarding your continued vaginal bleeding.  You can take 600 mg of Tylenol every 6 hours for pain.  Let 1 tablet of Zofran dissolve in your tongue every 8 hours as needed for nausea or vomiting.  You can continue to apply ice or heat as needed.  You can also try applying something topical, such as lidocaine cream to see if this improves her pain.  Return to the emergency department if you become significantly short of breath, if you have a positive pregnancy test with continued vaginal bleeding, if you pass out with vaginal bleeding, or develop other new, concerning symptoms.

## 2018-02-13 ENCOUNTER — Encounter (HOSPITAL_COMMUNITY): Payer: Self-pay | Admitting: *Deleted

## 2018-02-13 ENCOUNTER — Other Ambulatory Visit: Payer: Self-pay

## 2018-02-13 ENCOUNTER — Inpatient Hospital Stay (HOSPITAL_COMMUNITY)
Admission: AD | Admit: 2018-02-13 | Discharge: 2018-02-13 | Disposition: A | Discharge status: Home / Self Care | Payer: Medicaid Other | Attending: Family Medicine | Admitting: Family Medicine

## 2018-02-13 DIAGNOSIS — N926 Irregular menstruation, unspecified: Secondary | ICD-10-CM | POA: Insufficient documentation

## 2018-02-13 DIAGNOSIS — E282 Polycystic ovarian syndrome: Secondary | ICD-10-CM | POA: Insufficient documentation

## 2018-02-13 DIAGNOSIS — R102 Pelvic and perineal pain: Secondary | ICD-10-CM | POA: Insufficient documentation

## 2018-02-13 DIAGNOSIS — G8929 Other chronic pain: Secondary | ICD-10-CM | POA: Insufficient documentation

## 2018-02-13 DIAGNOSIS — K760 Fatty (change of) liver, not elsewhere classified: Secondary | ICD-10-CM | POA: Insufficient documentation

## 2018-02-13 DIAGNOSIS — N939 Abnormal uterine and vaginal bleeding, unspecified: Secondary | ICD-10-CM

## 2018-02-13 DIAGNOSIS — F17210 Nicotine dependence, cigarettes, uncomplicated: Secondary | ICD-10-CM | POA: Insufficient documentation

## 2018-02-13 DIAGNOSIS — Z880 Allergy status to penicillin: Secondary | ICD-10-CM | POA: Insufficient documentation

## 2018-02-13 HISTORY — DX: Benign neoplasm of colon, unspecified: D12.6

## 2018-02-13 HISTORY — DX: Familial adenomatous polyposis: D13.91

## 2018-02-13 LAB — COMPREHENSIVE METABOLIC PANEL
ALBUMIN: 4.1 g/dL (ref 3.5–5.0)
ALT: 42 U/L (ref 0–44)
AST: 57 U/L — ABNORMAL HIGH (ref 15–41)
Alkaline Phosphatase: 38 U/L (ref 38–126)
Anion gap: 10 (ref 5–15)
BILIRUBIN TOTAL: 0.5 mg/dL (ref 0.3–1.2)
BUN: 12 mg/dL (ref 6–20)
CHLORIDE: 104 mmol/L (ref 98–111)
CO2: 24 mmol/L (ref 22–32)
Calcium: 9.2 mg/dL (ref 8.9–10.3)
Creatinine, Ser: 0.91 mg/dL (ref 0.44–1.00)
GFR calc Af Amer: >60 mL/min (ref >60)
GFR calc non Af Amer: >60 mL/min (ref >60)
Glucose, Bld: 117 mg/dL — ABNORMAL HIGH (ref 70–99)
POTASSIUM: 3.9 mmol/L (ref 3.5–5.1)
Sodium: 138 mmol/L (ref 135–145)
Total Protein: 7.3 g/dL (ref 6.5–8.1)

## 2018-02-13 LAB — URINALYSIS, ROUTINE W REFLEX MICROSCOPIC
Bilirubin Urine: NEGATIVE
Glucose, UA: NEGATIVE mg/dL
Ketones, ur: NEGATIVE mg/dL
Leukocytes,Ua: NEGATIVE
NITRITE: NEGATIVE
Protein, ur: NEGATIVE mg/dL
SPECIFIC GRAVITY, URINE: 1.025 (ref 1.005–1.030)
pH: 6 (ref 5.0–8.0)

## 2018-02-13 LAB — CBC WITH DIFFERENTIAL/PLATELET
Basophils Absolute: 0.1 10*3/uL (ref 0.0–0.1)
Basophils Relative: 0 %
Eosinophils Absolute: 0.4 10*3/uL (ref 0.0–0.5)
Eosinophils Relative: 3 %
HCT: 38.4 % (ref 36.0–46.0)
Hemoglobin: 12.7 g/dL (ref 12.0–15.0)
Lymphocytes Relative: 26 %
Lymphs Abs: 3 10*3/uL (ref 0.7–4.0)
MCH: 30.4 pg (ref 26.0–34.0)
MCHC: 33.1 g/dL (ref 30.0–36.0)
MCV: 91.9 fL (ref 80.0–100.0)
MONOS PCT: 5 %
Monocytes Absolute: 0.6 10*3/uL (ref 0.1–1.0)
Neutro Abs: 7.4 10*3/uL (ref 1.7–7.7)
Neutrophils Relative %: 66 %
Platelets: 317 10*3/uL (ref 150–400)
RBC: 4.18 MIL/uL (ref 3.87–5.11)
RDW: 13.2 % (ref 11.5–15.5)
WBC: 11.4 10*3/uL — ABNORMAL HIGH (ref 4.0–10.5)
nRBC: 0 % (ref 0.0–0.2)

## 2018-02-13 LAB — URINALYSIS, MICROSCOPIC (REFLEX): RBC / HPF: >50 RBC/hpf (ref 0–5)

## 2018-02-13 LAB — POCT PREGNANCY, URINE: Preg Test, Ur: NEGATIVE

## 2018-02-13 MED ORDER — KETOROLAC TROMETHAMINE 30 MG/ML IJ SOLN
30.0000 mg | Freq: Once | INTRAMUSCULAR | Status: AC
  Administered 2018-02-13: 30 mg via INTRAMUSCULAR

## 2018-02-13 MED ORDER — KETOROLAC TROMETHAMINE 30 MG/ML IJ SOLN
30.0000 mg | Freq: Once | INTRAMUSCULAR | Status: DC
  Filled 2018-02-13: qty 1

## 2018-02-13 MED ORDER — NORGESTIMATE-ETH ESTRADIOL 0.25-35 MG-MCG PO TABS: 1.0000 | ORAL_TABLET | Freq: Every day | ORAL | 11 refills | Status: DC

## 2018-02-13 NOTE — MAU Note (Signed)
TOOK XS TYLENOL 2 TABS AT 0700.

## 2018-02-13 NOTE — Discharge Instructions (Signed)
Oral Contraception Use Oral contraceptive pills (OCPs) are medicines that you take to prevent pregnancy. OCPs work by:  Preventing the ovaries from releasing eggs.  Thickening mucus in the lower part of the uterus (cervix), which prevents sperm from entering the uterus.  Thinning the lining of the uterus (endometrium), which prevents a fertilized egg from attaching to the endometrium. OCPs are highly effective when taken exactly as prescribed. However, OCPs do not prevent sexually transmitted infections (STIs). Safe sex practices, such as using condoms while on an OCP, can help prevent STIs. Before taking OCPs, you may have a physical exam, blood test, and Pap test. A Pap test involves taking a sample of cells from your cervix to check for cancer. Discuss with your health care provider the possible side effects of the OCP you may be prescribed. When you start an OCP, be aware that it can take 2-3 months for your body to adjust to changes in hormone levels. How to take oral contraceptive pills Follow instructions from your health care provider about how to start taking your first cycle of OCPs. Your health care provider may recommend that you:  Start the pill on day 1 of your menstrual period. If you start at this time, you will not need any backup form of birth control (contraception), such as condoms.  Start the pill on the first Sunday after your menstrual period or on the day you get your prescription. In these cases, you will need to use backup contraception for the first week.  Start the pill at any time of your cycle. ? If you take the pill within 5 days of the start of your period, you will not need a backup form of contraception. ? If you start at any other time of your menstrual cycle, you will need to use another form of contraception for 7 days. If your OCP is the type called a minipill, it will protect you from pregnancy after taking it for 2 days (48 hours), and you can stop using  backup contraception after that time. After you have started taking OCPs:  If you forget to take 1 pill, take it as soon as you remember. Take the next pill at the regular time.  If you miss 2 or more pills, call your health care provider. Different pills have different instructions for missed doses. Use backup birth control until your next menstrual period starts.  If you use a 28-day pack that contains inactive pills and you miss 1 of the last 7 pills (pills with no hormones), throw away the rest of the non-hormone pills and start a new pill pack. No matter which day you start the OCP, you will always start a new pack on that same day of the week. Have an extra pack of OCPs and a backup contraceptive method available in case you miss some pills or lose your OCP pack. Follow these instructions at home:  Do not use any products that contain nicotine or tobacco, such as cigarettes and e-cigarettes. If you need help quitting, ask your health care provider.  Always use a condom to protect against STIs. OCPs do not protect against STIs.  Use a calendar to mark the days of your menstrual period.  Read the information and directions that came with your OCP. Talk to your health care provider if you have questions. Contact a health care provider if:  You develop nausea and vomiting.  You have abnormal vaginal discharge or bleeding.  You develop a rash.  You miss your menstrual period. Depending on the type of OCP you are taking, this may be a sign of pregnancy. Ask your health care provider for more information.  You are losing your hair.  You need treatment for mood swings or depression.  You get dizzy when taking the OCP.  You develop acne after taking the OCP.  You become pregnant or think you may be pregnant.  You have diarrhea, constipation, and abdominal pain or cramps.  You miss 2 or more pills. Get help right away if:  You develop chest pain.  You develop shortness of  breath.  You have an uncontrolled or severe headache.  You develop numbness or slurred speech.  You develop visual or speech problems.  You develop pain, redness, and swelling in your legs.  You develop weakness or numbness in your arms or legs. Summary  Oral contraceptive pills (OCPs) are medicines that you take to prevent pregnancy.  OCPs do not prevent sexually transmitted infections (STIs). Always use a condom to protect against STIs.  When you start an OCP, be aware that it can take 2-3 months for your body to adjust to changes in hormone levels.  Read all the information and directions that come with your OCP. This information is not intended to replace advice given to you by your health care provider. Make sure you discuss any questions you have with your health care provider. Document Released: 12/09/2010 Document Revised: 02/01/2016 Document Reviewed: 02/01/2016 Elsevier Interactive Patient Education  2019 [employer] Polycystic Ovarian Syndrome  Polycystic ovarian syndrome (PCOS) is a common hormonal disorder among women of reproductive age. In most women with PCOS, many small fluid-filled sacs (cysts) grow on the ovaries, and the cysts are not part of a normal menstrual cycle. PCOS can cause problems with your menstrual periods and make it difficult to get pregnant. It can also cause an increased risk of miscarriage with pregnancy. If it is not treated, PCOS can lead to serious health problems, such as diabetes and heart disease. What are the causes? The cause of PCOS is not known, but it may be the result of a combination of certain factors, such as:  Irregular menstrual cycle.  High levels of certain hormones (androgens).  Problems with the hormone that helps to control blood sugar (insulin resistance).  Certain genes. What increases the risk? This condition is more likely to develop in women who have a family history of PCOS. What are the signs or  symptoms? Symptoms of PCOS may include:  Multiple ovarian cysts.  Infrequent periods or no periods.  Periods that are too frequent or too heavy.  Unpredictable periods.  Inability to get pregnant (infertility) because of not ovulating.  Increased growth of hair on the face, chest, stomach, back, thumbs, thighs, or toes.  Acne or oily skin. Acne may develop during adulthood, and it may not respond to treatment.  Pelvic pain.  Weight gain or obesity.  Patches of thickened and dark brown or black skin on the neck, arms, breasts, or thighs (acanthosis nigricans).  Excess hair growth on the face, chest, abdomen, or upper thighs (hirsutism). How is this diagnosed? This condition is diagnosed based on:  Your medical history.  A physical exam, including a pelvic exam. Your health care provider may look for areas of increased hair growth on your skin.  Tests, such as: ? Ultrasound. This may be used to examine the ovaries and the lining of the uterus (endometrium) for cysts. ? Blood tests. These may  be used to check levels of sugar (glucose), female hormone (testosterone), and female hormones (estrogen and progesterone) in your blood. How is this treated? There is no cure for PCOS, but treatment can help to manage symptoms and prevent more health problems from developing. Treatment varies depending on:  Your symptoms.  Whether you want to have a baby or whether you need birth control (contraception). Treatment may include nutrition and lifestyle changes along with:  Progesterone hormone to start a menstrual period.  Birth control pills to help you have regular menstrual periods.  Medicines to make you ovulate, if you want to get pregnant.  Medicine to reduce excessive hair growth.  Surgery, in severe cases. This may involve making small holes in one or both of your ovaries. This decreases the amount of testosterone that your body produces. Follow these instructions at  home:  Take over-the-counter and prescription medicines only as told by your health care provider.  Follow a healthy meal plan. This can help you reduce the effects of PCOS. ? Eat a healthy diet that includes lean proteins, complex carbohydrates, fresh fruits and vegetables, low-fat dairy products, and healthy fats. Make sure to eat enough fiber.  If you are overweight, lose weight as told by your health care provider. ? Losing 10% of your body weight may improve symptoms. ? Your health care provider can determine how much weight loss is best for you and can help you lose weight safely.  Keep all follow-up visits as told by your health care provider. This is important. Contact a health care provider if:  Your symptoms do not get better with medicine.  You develop new symptoms. This information is not intended to replace advice given to you by your health care provider. Make sure you discuss any questions you have with your health care provider. Document Released: 04/15/2004 Document Revised: 08/18/2015 Document Reviewed: 06/07/2015 Elsevier Interactive Patient Education  2019 [employer]. Diet for Polycystic Ovary Syndrome Polycystic ovary syndrome (PCOS) is a disorder of the chemicals (hormones) that regulate a woman's reproductive system, including monthly periods (menstruation). The condition causes important hormones to be out of balance. PCOS can:  Stop your periods or make them irregular.  Cause cysts to develop on your ovaries.  Make it difficult to get pregnant.  Stop your body from responding to the effects of insulin (insulin resistance). Insulin resistance can lead to obesity and diabetes. Changing what you eat can help you manage PCOS and improve your health. Following a balanced diet can help you lose weight and improve the way that your body uses insulin. What are tips for following this plan?  Follow a balanced diet for meals and snacks. Eat breakfast, lunch, dinner,  and one or two snacks every day.  Include protein in each meal and snack.  Choose whole grains instead of products that are made with refined flour.  Eat a variety of foods.  Exercise regularly as told by your health care provider. Aim to do 30 or more minutes of exercise on most days of the week.  If you are overweight or obese: ? Pay attention to how many calories you eat. Cutting down on calories can help you lose weight. ? Work with your health care provider or a diet and nutrition specialist (dietitian) to figure out how many calories you need each day. What foods can I eat?  Fruits Include a variety of colors and types. All fruits are helpful for PCOS. Vegetables Include a variety of colors and types.  All vegetables are helpful for PCOS. Grains Whole grains, such as whole wheat. Whole-grain breads, crackers, cereals, and pasta. Unsweetened oatmeal, bulgur, barley, quinoa, and brown rice. Tortillas made from corn or whole-wheat flour. Meats and other proteins Low-fat (lean) proteins, such as fish, chicken, beans, eggs, and tofu. Dairy Low-fat dairy products, such as skim milk, cheese sticks, and yogurt. Beverages Low-fat or fat-free drinks, such as water, low-fat milk, sugar-free drinks, and small amounts of 100% fruit juice. Seasonings and condiments Ketchup. Mustard. Barbecue sauce. Relish. Low-fat or fat-free mayonnaise. Fats and oils Olive oil or canola oil. Walnuts and almonds. The items listed above may not be a complete list of recommended foods and beverages. Contact a dietitian for more options. What foods are not recommended? Foods that are high in calories or fat. Fried foods. Sweets. Products that are made from refined white flour, including white bread, pastries, white rice, and pasta. The items listed above may not be a complete list of foods and beverages to avoid. Contact a dietitian for more information. Summary  PCOS is a hormonal imbalance that affects a  woman's reproductive system.  You can help to manage your PCOS by exercising regularly and eating a healthy, varied diet of vegetables, fruit, whole grains, low-fat (lean) protein, and low-fat dairy products.  Changing what you eat can improve the way that your body uses insulin, help your hormones reach normal levels, and help you lose weight. This information is not intended to replace advice given to you by your health care provider. Make sure you discuss any questions you have with your health care provider. Document Released: 04/13/2015 Document Revised: 10/24/2016 Document Reviewed: 10/24/2016 Elsevier Interactive Patient Education  2019 [employer].

## 2018-02-13 NOTE — MAU Provider Note (Signed)
Chief Complaint:  Vaginal Bleeding   First Provider Initiated Contact with Patient 02/13/18 2038       HPI: Karen Drake is a 23 y.o. [id number] who presents to maternity admissions reporting irregular bleeding off and on since December with pelvic cramping and pain.  Was seen multiple times in EDs for pain and bleeding recently .  Has had Pelvic US (01/06/18) which showed PCOS with multiple small follicles.  Had a CT (01/25/18) which did not find an obvious source for the pain.     Took Tylenol once this morning.  Cannot take NSAIDS due to hx of gastritis.  Was on OCPs in past but stopped them "because we are looking at an IUD with my Health Dept Doctor, just haven't done it yet".  . She reports vaginal bleeding, but no vaginal itching/burning, urinary symptoms, h/a, dizziness, n/v, or fever/chills.    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 Sees a Hepatologist at [facility] for her Fatty Liver disease.   Vaginal Bleeding  The patient's primary symptoms include pelvic pain and vaginal bleeding. The patient's pertinent negatives include no genital itching, genital lesions or genital odor. This is a recurrent problem. The problem occurs intermittently. The problem has been unchanged. The problem affects both sides. She is not pregnant. Associated symptoms include abdominal pain. Pertinent negatives include no chills, fever, flank pain or frequency. The vaginal discharge was bloody. The vaginal bleeding is spotting. She has not been passing clots. She has not been passing tissue. She has tried acetaminophen for the symptoms. The treatment provided no relief. It is unknown whether or not her partner has an STD.   RN Note: PT SAYS LMP 12-16-31ST.   THEN ON 1-3- FELT GUSH OF BLOOD.  WENT TO ER-  DID U/S- THOUGHT RUPTURED CYST-   STILL HAS PAIN  LOWER ABD  AND RED BLEEDING  ON PAD.  IN  TRIAGE - PAD -      SMALL AMT  DARK RED -  .   SOMETIMES  HAS TINY   CLOTS.   NO BIRTH CONTROL. LAST SEX-    DEC  Past Medical History: Past Medical History:  Diagnosis Date  . Acid reflux   . 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 FAP (familial adenomatous polyposis)   . Fatty liver   . Hiatal hernia   . Polycystic ovary syndrome   . Thyroid disease     Past obstetric history: OB History  Gravida Para Term Preterm AB Living  0 0 0 0 0 0  SAB TAB Ectopic Multiple Live Births  0 0 0 0 0    Past Surgical History: Past Surgical History:  Procedure Laterality Date  . COLON SURGERY     total colectomy  . CYST REMOVAL LEG Right    posterior right knee    Family History: Family History  Problem Relation Age of Onset  . Cancer Other   . Familial polyposis Mother   . Familial polyposis Brother     Social History: Social History   Tobacco Use  . Smoking status: Current Every Day Smoker    Packs/day: 0.25    Types: Cigarettes  . Smokeless tobacco: Never Used  Substance Use Topics  . Alcohol use: Never    Frequency: Never  . Drug use: Never    Allergies:  Allergies  Allergen Reactions  . Penicillins Anaphylaxis    Has patient had a PCN reaction causing immediate rash, facial/tongue/throat swelling,  SOB or lightheadedness with hypotension: Yes Has patient had a PCN reaction causing severe rash involving mucus membranes or skin necrosis: No Has patient had a PCN reaction that required hospitalization: Yes Has patient had a PCN reaction occurring within the last 10 years: Yes If all of the above answers are "NO", then may proceed with Cephalosporin use.  Marland Kitchen Pineapple Anaphylaxis  . Nsaids     Due to gastritis and GERD  . Tape Rash    NO PAPER TAPE    Meds:  Medications Prior to Admission  Medication Sig Dispense Refill Last Dose  . acetaminophen (TYLENOL) 500 MG tablet Take 1,000 mg by mouth every 6 (six) hours as needed for mild pain.   Past Week at Unknown time  . levothyroxine (SYNTHROID, LEVOTHROID) 25 MCG tablet Take 25 mcg by mouth  daily.  3 Past Week at Unknown time  . Multiple Vitamins-Minerals (MULTIVITAMIN GUMMIES ADULTS PO) Take 1 each by mouth daily.    Past Week at Unknown time  . ondansetron (ZOFRAN) 4 MG tablet Take 1 tablet (4 mg total) by mouth every 6 (six) hours. 12 tablet 0     I have reviewed patient's Past Medical Hx, Surgical Hx, Family Hx, Social Hx, medications and allergies.  ROS:  Review of Systems  Constitutional: Negative for chills and fever.  Gastrointestinal: Positive for abdominal pain.  Genitourinary: Positive for pelvic pain and vaginal bleeding. Negative for flank pain and frequency.   Other systems negative    Physical Exam   Patient Vitals for the past 24 hrs:  BP Temp Temp src Pulse Height Weight  02/13/18 1927 115/76 98.5 F (36.9 C) Oral (!) 106 5' 7.25" (1.708 m) 94 kg   Constitutional: Well-developed, well-nourished female in no acute distress.  Cardiovascular: normal rate and rhythm Respiratory: normal effort, no distress.  GI: Abd soft, mildly tender over lower abdomen.  Nondistended.  No rebound, No guarding.  MS: Extremities nontender, no edema, normal ROM Neurologic: Alert and oriented x 4.   Grossly nonfocal. GU: Neg CVAT. Skin:  Warm and Dry Psych:  Affect appropriate.  PELVIC EXAM: No bleeding noted on exam.   Cervix firm, anterior, neg CMT, uterus nontender, nonenlarged, adnexa without tenderness, enlargement, or mass  Exam is limited by body habitus.    Labs: --/--/O POS, O POS (10/28 1806) Results for orders placed or performed during the hospital encounter of 02/13/18 (from the past 24 hour(s))  Urinalysis, Routine w reflex microscopic     Status: Abnormal   Collection Time: 02/13/18  7:47 PM  Result Value Ref Range   Color, Urine YELLOW YELLOW   APPearance CLEAR CLEAR   Specific Gravity, Urine 1.025 1.005 - 1.030   pH 6.0 5.0 - 8.0   Glucose, UA NEGATIVE NEGATIVE mg/dL   Hgb urine dipstick LARGE (A) NEGATIVE   Bilirubin Urine NEGATIVE NEGATIVE    Ketones, ur NEGATIVE NEGATIVE mg/dL   Protein, ur NEGATIVE NEGATIVE mg/dL   Nitrite NEGATIVE NEGATIVE   Leukocytes,Ua NEGATIVE NEGATIVE  Urinalysis, Microscopic (reflex)     Status: Abnormal   Collection Time: 02/13/18  7:47 PM  Result Value Ref Range   RBC / HPF >50 0 - 5 RBC/hpf   WBC, UA 0-5 0 - 5 WBC/hpf   Bacteria, UA RARE (A) NONE SEEN   Squamous Epithelial / LPF 0-5 0 - 5  Pregnancy, urine POC     Status: None   Collection Time: 02/13/18  7:49 PM  Result Value Ref Range  Preg Test, Ur NEGATIVE NEGATIVE  CBC with Differential/Platelet     Status: Abnormal   Collection Time: 02/13/18  8:42 PM  Result Value Ref Range   WBC 11.4 (H) 4.0 - 10.5 K/uL   RBC 4.18 3.87 - 5.11 MIL/uL   Hemoglobin 12.7 12.0 - 15.0 g/dL   HCT 38.4 36.0 - 46.0 %   MCV 91.9 80.0 - 100.0 fL   MCH 30.4 26.0 - 34.0 pg   MCHC 33.1 30.0 - 36.0 g/dL   RDW 13.2 11.5 - 15.5 %   Platelets 317 150 - 400 K/uL   nRBC 0.0 0.0 - 0.2 %   Neutrophils Relative % 66 %   Neutro Abs 7.4 1.7 - 7.7 K/uL   Lymphocytes Relative 26 %   Lymphs Abs 3.0 0.7 - 4.0 K/uL   Monocytes Relative 5 %   Monocytes Absolute 0.6 0.1 - 1.0 K/uL   Eosinophils Relative 3 %   Eosinophils Absolute 0.4 0.0 - 0.5 K/uL   Basophils Relative 0 %   Basophils Absolute 0.1 0.0 - 0.1 K/uL  Comprehensive metabolic panel     Status: Abnormal   Collection Time: 02/13/18  8:42 PM  Result Value Ref Range   Sodium 138 135 - 145 mmol/L   Potassium 3.9 3.5 - 5.1 mmol/L   Chloride 104 98 - 111 mmol/L   CO2 24 22 - 32 mmol/L   Glucose, Bld 117 (H) 70 - 99 mg/dL   BUN 12 6 - 20 mg/dL   Creatinine, Ser 0.91 0.44 - 1.00 mg/dL   Calcium 9.2 8.9 - 10.3 mg/dL   Total Protein 7.3 6.5 - 8.1 g/dL   Albumin 4.1 3.5 - 5.0 g/dL   AST 57 (H) 15 - 41 U/L   ALT 42 0 - 44 U/L   Alkaline Phosphatase 38 38 - 126 U/L   Total Bilirubin 0.5 0.3 - 1.2 mg/dL   GFR calc non Af Amer >60 >60 mL/min   GFR calc Af Amer >60 >60 mL/min   Anion gap 10 5 - 15    Imaging:   (PRIOR CT STUDY) Ct Renal Stone Study  Result Date: 01/25/2018 CLINICAL DATA:  Flank pain with stone disease suspected EXAM: CT ABDOMEN AND PELVIS WITHOUT CONTRAST TECHNIQUE: Multidetector CT imaging of the abdomen and pelvis was performed following the standard protocol without IV contrast. COMPARISON:  None. FINDINGS: Lower chest:  No contributory findings. Hepatobiliary: Hepatic steatosis.No evidence of biliary obstruction or stone. Pancreas: Unremarkable. Spleen: Unremarkable. Adrenals/Urinary Tract: Negative adrenals. No hydronephrosis or stone. Unremarkable bladder. 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 No obstruction or inflammatory changes. No appendicitis. Vascular/Lymphatic: No detected vascular abnormality. No mass or adenopathy. Reproductive:Unremarkable. Other: No ascites or pneumoperitoneum. Musculoskeletal: No acute abnormalities. IMPRESSION: 1. No acute finding or explanation for pain. 2. Total colectomy with ileal pouch. 3. Hepatic steatosis. Electronically Signed   By: Monte Fantasia M.D.   On: 01/25/2018 05:08    MAU Course/MDM: I have ordered labs as follows:  CBC and CMET are within normal limits LFTs are improved from last test.  No anemia noted.  Imaging ordered: Imaging reviewed   New imaging not indicated Results reviewed.   Discussed this cramping and irregular bleeding is likely related to PCOS.  Reviewed options for treatment including Metformin if wants to conceive (does not want to) or OCPs   States plans IUD but has not made an appointment.  Agrees to start OCPs until  she can get there. WIll need to use Tylenol for pain.    Treatments in MAU included Toradol for pain.  >>  Excellent relief, pain is down to a "2" Pt stable at time of discharge.  Assessment: Chronic Pelvic pain Irregular menses Polycystic Ovarian Syndrome  Plan: Discharge home Recommend Start and maintain combined  OCPs until she can followup with GYN provider Rx sent for Sprintec for PCOS, discussed may take 1-2 cycles to regulate cycle Tylenol for pain Follow up in HD for ongoing care  Encouraged to return here or to other Urgent Care/ED if she develops worsening of symptoms, increase in pain, fever, or other concerning symptoms.   Hansel Feinstein CNM, MSN Certified Nurse-Midwife 02/13/2018 9:05 PM

## 2018-02-13 NOTE — MAU Note (Signed)
PT SAYS LMP 12-16-31ST.   THEN ON 1-3- FELT GUSH OF BLOOD.  WENT TO ER-  DID U/S- THOUGHT RUPTURED CYST-   STILL HAS PAIN  LOWER ABD  AND RED BLEEDING  ON PAD.  IN  TRIAGE - PAD -      SMALL AMT  DARK RED -  .   SOMETIMES  HAS TINY   CLOTS.   NO BIRTH CONTROL. LAST SEX-   DEC

## 2018-03-25 ENCOUNTER — Other Ambulatory Visit: Payer: Self-pay

## 2018-03-25 ENCOUNTER — Emergency Department (HOSPITAL_COMMUNITY)
Admission: EM | Admit: 2018-03-25 | Discharge: 2018-03-25 | Disposition: A | Discharge status: Home / Self Care | Payer: Medicaid Other | Attending: Emergency Medicine | Admitting: Emergency Medicine

## 2018-03-25 ENCOUNTER — Encounter (HOSPITAL_COMMUNITY): Payer: Self-pay

## 2018-03-25 DIAGNOSIS — R197 Diarrhea, unspecified: Secondary | ICD-10-CM | POA: Insufficient documentation

## 2018-03-25 DIAGNOSIS — A084 Viral intestinal infection, unspecified: Secondary | ICD-10-CM | POA: Insufficient documentation

## 2018-03-25 DIAGNOSIS — F17210 Nicotine dependence, cigarettes, uncomplicated: Secondary | ICD-10-CM | POA: Insufficient documentation

## 2018-03-25 DIAGNOSIS — Z79899 Other long term (current) drug therapy: Secondary | ICD-10-CM | POA: Insufficient documentation

## 2018-03-25 DIAGNOSIS — R110 Nausea: Secondary | ICD-10-CM | POA: Insufficient documentation

## 2018-03-25 DIAGNOSIS — E119 Type 2 diabetes mellitus without complications: Secondary | ICD-10-CM | POA: Insufficient documentation

## 2018-03-25 MED ORDER — ONDANSETRON 4 MG PO TBDP: 4.0000 mg | ORAL_TABLET | Freq: Three times a day (TID) | ORAL | 0 refills | Status: DC | PRN

## 2018-03-25 NOTE — ED Triage Notes (Signed)
Pt reports that she was exposed to someone at work yesterday that had a fever. Today she woke up feeling sick. Endorses nausea and chills. Endorses cough, but states that that is not unusual this time of year with her allergies. No coughing or fever during triage. Denies shortness of breath. Denies travel or known exposure to COVID-19.

## 2018-03-25 NOTE — ED Provider Notes (Signed)
[facility] DEPT Provider Note   CSN: [id number] Arrival date & time: 03/25/18  1758    History   Chief Complaint Chief Complaint  Patient presents with  . Chills    HPI    Karen Drake is a 23 y.o. female with a PMHx of PCOS, borderline diabetes, GERD, hiatal hernia, and other conditions listed below, who presents to the ED with complaints of chills that began today while she was at work.  Patient states that there is an employee at her work who had a fever and nausea (no cough to her knowledge) and she was around her yesterday, states that today she was sitting at her desk when suddenly she started having chills and nausea.  She did not try anything for it, no known aggravating factors.  She was sent home early from work and her boss "told her to get checked out".  Patient states that for the last 3 days she has had diarrhea, about 7 episodes per day of nonbloody diarrhea.  No other known exposures to anybody else that was sick aside from the employee at work, no known [place] exposure.  No recent travel.  No suspicious food intake.  Of note, she states that she has a chronic dry cough which is not new, states that it is from her allergies and from "the dry air".  She denies that this is new at all.  She also denies having any fevers, sore throat, rhinorrhea, ear pain or drainage, other URI symptoms, chest pain, shortness of breath, abdominal pain, vomiting, constipation, melena, hematochezia, dysuria, hematuria, numbness, tingling, focal weakness, or any other complaints at this time.  The history is provided by the patient and medical records. No language interpreter was used.    Past Medical History:  Diagnosis Date  . Acid reflux   . 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 FAP (familial adenomatous polyposis)   . Fatty liver   . Hiatal hernia   . Polycystic ovary syndrome   . Thyroid disease     There are no active  problems to display for this patient.   Past Surgical History:  Procedure Laterality Date  . COLON SURGERY     total colectomy  . CYST REMOVAL LEG Right    posterior right knee     OB History    Gravida  0   Para  0   Term  0   Preterm  0   AB  0   Living  0     SAB  0   TAB  0   Ectopic  0   Multiple  0   Live Births  0            Home Medications    Prior to Admission medications   Medication Sig Start Date End Date Taking? Authorizing Provider  acetaminophen (TYLENOL) 500 MG tablet Take 1,000 mg by mouth every 6 (six) hours as needed for mild pain.    [provider]  levothyroxine (SYNTHROID, LEVOTHROID) 25 MCG tablet Take 25 mcg by mouth daily. 10/20/17   [provider]  Multiple Vitamins-Minerals (MULTIVITAMIN GUMMIES ADULTS PO) Take 1 each by mouth daily.     [provider]  norgestimate-ethinyl estradiol (ORTHO-CYCLEN,SPRINTEC,PREVIFEM) 0.25-35 MG-MCG tablet Take 1 tablet by mouth daily. 02/13/18   Seabron Spates, CNM  ondansetron (ZOFRAN) 4 MG tablet Take 1 tablet (4 mg total) by mouth every 6 (six) hours. 01/25/18  McDonald, Mia A, PA-C    Family History Family History  Problem Relation Age of Onset  . Cancer Other   . Familial polyposis Mother   . Familial polyposis Brother     Social History Social History   Tobacco Use  . Smoking status: Current Every Day Smoker    Packs/day: 0.25    Types: Cigarettes  . Smokeless tobacco: Never Used  Substance Use Topics  . Alcohol use: Never    Frequency: Never  . Drug use: Never     Allergies   Penicillins; Pineapple; Nsaids; and Tape   Review of Systems Review of Systems  Constitutional: Positive for chills. Negative for fever.  HENT: Negative for ear discharge, ear pain, rhinorrhea and sore throat.   Respiratory: Positive for cough (chronic dry cough due to allergies). Negative for shortness of breath.   Cardiovascular: Negative for chest pain.   Gastrointestinal: Positive for diarrhea and nausea. Negative for abdominal pain, blood in stool, constipation and vomiting.  Genitourinary: Negative for dysuria and hematuria.  Musculoskeletal: Negative for arthralgias and myalgias.  Skin: Negative for color change.  Allergic/Immunologic: Negative for immunocompromised state.  Neurological: Negative for weakness and numbness.  Psychiatric/Behavioral: Negative for confusion.   All other systems reviewed and are negative for acute change except as noted in the HPI.    Physical Exam Updated Vital Signs BP 127/84 (BP Location: Right Arm)   Pulse 82   Temp 98.5 F (36.9 C) (Oral)   Resp 18   SpO2 100%   Physical Exam Vitals signs and nursing note reviewed.  Constitutional:      General: She is not in acute distress.    Appearance: Normal appearance. She is well-developed. She is not toxic-appearing.     Comments: Afebrile, nontoxic, NAD  HENT:     Head: Normocephalic and atraumatic.  Eyes:     General:        Right eye: No discharge.        Left eye: No discharge.     Conjunctiva/sclera: Conjunctivae normal.  Neck:     Musculoskeletal: Normal range of motion and neck supple.  Cardiovascular:     Rate and Rhythm: Normal rate and regular rhythm.     Pulses: Normal pulses.     Heart sounds: Normal heart sounds, S1 normal and S2 normal. No murmur. No friction rub. No gallop.   Pulmonary:     Effort: Pulmonary effort is normal. No respiratory distress.     Breath sounds: Normal breath sounds. No decreased breath sounds, wheezing, rhonchi or rales.     Comments: CTAB in all lung fields, no w/r/r, no hypoxia or increased WOB, speaking in full sentences, SpO2 100% on RA, no cough during entirety of exam Abdominal:     General: Bowel sounds are normal. There is no distension.     Palpations: Abdomen is soft. Abdomen is not rigid.     Tenderness: There is no abdominal tenderness. There is no right CVA tenderness, left CVA tenderness,  guarding or rebound. Negative signs include Murphy's sign and McBurney's sign.     Comments: Soft, NTND, +BS throughout, no r/g/r, neg murphy's, neg mcburney's, no CVA TTP   Musculoskeletal: Normal range of motion.  Skin:    General: Skin is warm and dry.     Findings: No rash.  Neurological:     Mental Status: She is alert and oriented to person, place, and time.     Sensory: Sensation is intact. No sensory deficit.  Motor: Motor function is intact.  Psychiatric:        Mood and Affect: Mood and affect normal.        Behavior: Behavior normal.      ED Treatments / Results  Labs (all labs ordered are listed, but only abnormal results are displayed) Labs Reviewed - No data to display  EKG None  Radiology No results found.  Procedures Procedures (including critical care time)  Medications Ordered in ED Medications - No data to display   Initial Impression / Assessment and Plan / ED Course  I have reviewed the triage vital signs and the nursing notes.  Pertinent labs & imaging results that were available during my care of the patient were reviewed by me and considered in my medical decision making (see chart for details).        23 y.o. female here with diarrhea for the last 3 days now with nausea and chills today.  Recent exposure to similar illness at work, states her boss wanted her checked out.  On exam, no abdominal tenderness, afebrile and nontoxic-appearing, clear lung exam, vital signs stable.  Patient states that she has a chronic dry cough because of allergies, but no other URI symptoms.  Doubt need for COVID 19 testing, doubt that this is causing her symptoms.  She likely has a viral gastroenteritis given sick contacts at work and diarrheal illness for the last several days.  Doubt need for labs or imaging at this time as patient appears well, adequately hydrated, and has no abdominal tenderness.  Will treat symptomatically with Zofran, advised other OTC  remedies for symptom control, and advised BRAT diet. F/up with PCP in 1wk for recheck. I explained the diagnosis and have given explicit precautions to return to the ER including for any other new or worsening symptoms. The patient understands and accepts the medical plan as it's been dictated and I have answered their questions. Discharge instructions concerning home care and prescriptions have been given. The patient is STABLE and is discharged to home in good condition.    Final Clinical Impressions(s) / ED Diagnoses   Final diagnoses:  Nausea  Diarrhea, unspecified type  Viral gastroenteritis    ED Discharge Orders         Ordered    ondansetron (ZOFRAN ODT) 4 MG disintegrating tablet  Every 8 hours PRN     03/25/18 [address] Mykeisha Dysert, [place], Hershal Coria 03/25/18 1831    Jola Schmidt, MD 03/25/18 [phone]

## 2018-03-25 NOTE — Discharge Instructions (Addendum)
Use zofran as prescribed, as needed for nausea. Alternate between tylenol and motrin as needed for pain. May consider using over the counter tums, maalox, pepto bismol, or other over the counter remedies to help with symptoms. Stay well hydrated with small sips of fluids throughout the day. Follow a BRAT (banana-rice-applesauce-toast) diet as described below for the next 24-48 hours. The 'BRAT' diet is suggested, then progress to diet as tolerated as symptoms abate. Call your regular doctor if bloody stools, persistent diarrhea, vomiting, fever or abdominal pain. Follow up with your regular doctor in 1 week for recheck of symptoms. Return to ER for changing or worsening of symptoms.

## 2018-04-05 ENCOUNTER — Other Ambulatory Visit: Payer: Self-pay

## 2018-04-05 ENCOUNTER — Encounter (HOSPITAL_COMMUNITY): Payer: Self-pay | Admitting: Emergency Medicine

## 2018-04-05 ENCOUNTER — Emergency Department (HOSPITAL_COMMUNITY)
Admission: EM | Admit: 2018-04-05 | Discharge: 2018-04-05 | Disposition: A | Discharge status: Home / Self Care | Payer: Medicaid Other | Attending: Emergency Medicine | Admitting: Emergency Medicine

## 2018-04-05 DIAGNOSIS — R102 Pelvic and perineal pain: Secondary | ICD-10-CM | POA: Insufficient documentation

## 2018-04-05 DIAGNOSIS — R110 Nausea: Secondary | ICD-10-CM | POA: Insufficient documentation

## 2018-04-05 DIAGNOSIS — E119 Type 2 diabetes mellitus without complications: Secondary | ICD-10-CM | POA: Insufficient documentation

## 2018-04-05 DIAGNOSIS — Z79899 Other long term (current) drug therapy: Secondary | ICD-10-CM | POA: Insufficient documentation

## 2018-04-05 DIAGNOSIS — N939 Abnormal uterine and vaginal bleeding, unspecified: Secondary | ICD-10-CM | POA: Insufficient documentation

## 2018-04-05 DIAGNOSIS — F17210 Nicotine dependence, cigarettes, uncomplicated: Secondary | ICD-10-CM | POA: Insufficient documentation

## 2018-04-05 LAB — BASIC METABOLIC PANEL
Anion gap: 9 (ref 5–15)
BUN: 8 mg/dL (ref 6–20)
CO2: 26 mmol/L (ref 22–32)
Calcium: 9.7 mg/dL (ref 8.9–10.3)
Chloride: 106 mmol/L (ref 98–111)
Creatinine, Ser: 0.88 mg/dL (ref 0.44–1.00)
GFR calc Af Amer: >60 mL/min (ref >60)
GFR calc non Af Amer: >60 mL/min (ref >60)
Glucose, Bld: 93 mg/dL (ref 70–99)
Potassium: 3.8 mmol/L (ref 3.5–5.1)
Sodium: 141 mmol/L (ref 135–145)

## 2018-04-05 LAB — CBC WITH DIFFERENTIAL/PLATELET
Abs Immature Granulocytes: 0.02 10*3/uL (ref 0.00–0.07)
Basophils Absolute: 0.1 10*3/uL (ref 0.0–0.1)
Basophils Relative: 1 %
Eosinophils Absolute: 0.3 10*3/uL (ref 0.0–0.5)
Eosinophils Relative: 3 %
HCT: 44.3 % (ref 36.0–46.0)
Hemoglobin: 13.8 g/dL (ref 12.0–15.0)
Immature Granulocytes: 0 %
Lymphocytes Relative: 21 %
Lymphs Abs: 2 10*3/uL (ref 0.7–4.0)
MCH: 29.5 pg (ref 26.0–34.0)
MCHC: 31.2 g/dL (ref 30.0–36.0)
MCV: 94.7 fL (ref 80.0–100.0)
Monocytes Absolute: 0.8 10*3/uL (ref 0.1–1.0)
Monocytes Relative: 8 %
Neutro Abs: 6.6 10*3/uL (ref 1.7–7.7)
Neutrophils Relative %: 67 %
Platelets: 362 10*3/uL (ref 150–400)
RBC: 4.68 MIL/uL (ref 3.87–5.11)
RDW: 12.8 % (ref 11.5–15.5)
WBC: 9.7 10*3/uL (ref 4.0–10.5)
nRBC: 0 % (ref 0.0–0.2)

## 2018-04-05 LAB — WET PREP, GENITAL
Clue Cells Wet Prep HPF POC: NONE SEEN
Sperm: NONE SEEN
Trich, Wet Prep: NONE SEEN
WBC, Wet Prep HPF POC: NONE SEEN
Yeast Wet Prep HPF POC: NONE SEEN

## 2018-04-05 LAB — URINALYSIS, ROUTINE W REFLEX MICROSCOPIC
Bacteria, UA: NONE SEEN
Bilirubin Urine: NEGATIVE
Glucose, UA: NEGATIVE mg/dL
Ketones, ur: NEGATIVE mg/dL
Leukocytes,Ua: NEGATIVE
Nitrite: NEGATIVE
Protein, ur: NEGATIVE mg/dL
RBC / HPF: >50 RBC/hpf — ABNORMAL HIGH (ref 0–5)
Specific Gravity, Urine: 1.024 (ref 1.005–1.030)
pH: 6 (ref 5.0–8.0)

## 2018-04-05 LAB — PREGNANCY, URINE: Preg Test, Ur: NEGATIVE

## 2018-04-05 MED ORDER — HYDROCODONE-ACETAMINOPHEN 5-325 MG PO TABS
2.0000 | ORAL_TABLET | Freq: Once | ORAL | Status: AC
  Administered 2018-04-05: 2 via ORAL
  Filled 2018-04-05: qty 2

## 2018-04-05 MED ORDER — ONDANSETRON 4 MG PO TBDP
4.0000 mg | ORAL_TABLET | Freq: Once | ORAL | Status: AC
  Administered 2018-04-05: 4 mg via ORAL
  Filled 2018-04-05: qty 1

## 2018-04-05 MED ORDER — ONDANSETRON 4 MG PO TBDP: 4.0000 mg | ORAL_TABLET | Freq: Three times a day (TID) | ORAL | 0 refills | Status: DC | PRN

## 2018-04-05 MED ORDER — ACETAMINOPHEN 325 MG PO TABS: 650.0000 mg | ORAL_TABLET | Freq: Once | ORAL | Status: DC

## 2018-04-05 NOTE — ED Triage Notes (Signed)
Patient c/o pelvic pain since having IUD placed x3 weeks ago. Denies vaginal bleeding.

## 2018-04-05 NOTE — Discharge Instructions (Addendum)
Your gonorrhea and chlamydia tests are pending and may take a few days to return. Please practice safe sex until you know your results are negative. Thank you for allowing me to care for you today. Please return to the emergency department if you have new or worsening symptoms. Take your medications as instructed.

## 2018-04-05 NOTE — ED Provider Notes (Signed)
[facility] DEPT Provider Note   CSN: [id number] Arrival date & time: 04/05/18  1611    History   Chief Complaint Chief Complaint  Patient presents with  . Pelvic Pain    HPI Karen Drake is a 23 y.o. female.     Patient is a 23 year old female with past medical history of diabetes who presents the emergency department for pelvic pain.  Patient reports that she had an IUD placed by her OB/GYN 3 weeks ago.  Reports that since the IUD placement she has had constant lower pelvic pain.  Also reports vaginal bleeding since then.  Reports that she called her OB/GYN office today and they told her to go to the emergency department.  She denies any fever, chills, vomiting.  She is nauseous.  She denies any vaginal discharge.  She is unsure if she is pregnant.  There are no exacerbating or relieving factors.     Past Medical History:  Diagnosis Date  . Acid reflux   . 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 FAP (familial adenomatous polyposis)   . Fatty liver   . Hiatal hernia   . Polycystic ovary syndrome   . Thyroid disease     There are no active problems to display for this patient.   Past Surgical History:  Procedure Laterality Date  . COLON SURGERY     total colectomy  . CYST REMOVAL LEG Right    posterior right knee     OB History    Gravida  0   Para  0   Term  0   Preterm  0   AB  0   Living  0     SAB  0   TAB  0   Ectopic  0   Multiple  0   Live Births  0            Home Medications    Prior to Admission medications   Medication Sig Start Date End Date Taking? Authorizing Provider  acetaminophen (TYLENOL) 500 MG tablet Take 1,000 mg by mouth every 6 (six) hours as needed for mild pain.   Yes [provider]  diphenhydrAMINE (BENADRYL) 25 mg capsule Take 25 mg by mouth every 6 (six) hours as needed for allergies.   Yes [provider]  levothyroxine (SYNTHROID,  LEVOTHROID) 25 MCG tablet Take 25 mcg by mouth daily. 10/20/17  Yes [provider]  Multiple Vitamins-Minerals (MULTIVITAMIN GUMMIES ADULTS PO) Take 1 tablet by mouth daily.    Yes [provider]  norgestimate-ethinyl estradiol (ORTHO-CYCLEN,SPRINTEC,PREVIFEM) 0.25-35 MG-MCG tablet Take 1 tablet by mouth daily. Patient not taking: Reported on 04/05/2018 02/13/18   Seabron Spates, CNM  ondansetron (ZOFRAN ODT) 4 MG disintegrating tablet Take 1 tablet (4 mg total) by mouth every 8 (eight) hours as needed for up to 3 days for nausea or vomiting. 04/05/18 04/08/18  Madilyn Hook A, PA-C  ondansetron (ZOFRAN) 4 MG tablet Take 1 tablet (4 mg total) by mouth every 6 (six) hours. Patient not taking: Reported on 04/05/2018 01/25/18   Joanne Gavel, PA-C    Family History Family History  Problem Relation Age of Onset  . Cancer Other   . Familial polyposis Mother   . Familial polyposis Brother     Social History Social History   Tobacco Use  . Smoking status: Current Every Day Smoker    Packs/day: 0.25    Types: Cigarettes  . Smokeless  tobacco: Never Used  Substance Use Topics  . Alcohol use: Never    Frequency: Never  . Drug use: Never     Allergies   Penicillins; Pineapple; Nsaids; and Tape   Review of Systems Review of Systems  Constitutional: Negative for activity change, appetite change, chills and fever.  HENT: Negative for congestion, ear pain, rhinorrhea and sore throat.   Eyes: Negative for pain and visual disturbance.  Respiratory: Negative for cough and shortness of breath.   Cardiovascular: Negative for chest pain and palpitations.  Gastrointestinal: Positive for abdominal pain and nausea. Negative for blood in stool, constipation, diarrhea and vomiting.  Genitourinary: Positive for pelvic pain and vaginal bleeding. Negative for difficulty urinating, dysuria, flank pain, hematuria, urgency, vaginal discharge and vaginal pain.  Musculoskeletal: Negative for  arthralgias and back pain.  Skin: Negative for color change and rash.  Neurological: Negative for dizziness, seizures and syncope.  All other systems reviewed and are negative.    Physical Exam Updated Vital Signs BP 118/81 (BP Location: Left Arm)   Pulse 78   Temp 98.1 F (36.7 C) (Oral)   Resp 16   SpO2 100%   Physical Exam Vitals signs and nursing note reviewed.  Constitutional:      General: She is not in acute distress.    Appearance: She is well-developed.  HENT:     Head: Normocephalic and atraumatic.  Eyes:     Conjunctiva/sclera: Conjunctivae normal.  Neck:     Musculoskeletal: Neck supple.  Cardiovascular:     Rate and Rhythm: Normal rate and regular rhythm.     Heart sounds: No murmur.  Pulmonary:     Effort: Pulmonary effort is normal. No respiratory distress.     Breath sounds: Normal breath sounds.  Abdominal:     General: Abdomen is flat.     Palpations: Abdomen is soft.     Tenderness: There is abdominal tenderness in the right lower quadrant, suprapubic area and left lower quadrant. There is no right CVA tenderness, left CVA tenderness, guarding or rebound. Negative signs include Murphy's sign, Rovsing's sign, McBurney's sign, psoas sign and obturator sign.     Comments: Very mild tenderness. No rebound or guarding  Genitourinary:    Comments: Mild vaginal bleeding. IUD string is in place. Skin:    General: Skin is warm and dry.  Neurological:     Mental Status: She is alert.      ED Treatments / Results  Labs (all labs ordered are listed, but only abnormal results are displayed) Labs Reviewed  URINALYSIS, ROUTINE W REFLEX MICROSCOPIC - Abnormal; Notable for the following components:      Result Value   APPearance HAZY (*)    Hgb urine dipstick LARGE (*)    RBC / HPF >50 (*)    All other components within normal limits  WET PREP, GENITAL  PREGNANCY, URINE  BASIC METABOLIC PANEL  CBC WITH DIFFERENTIAL/PLATELET  GC/CHLAMYDIA PROBE AMP ([facility]) NOT AT [facility]    EKG None  Radiology No results found.  Procedures Procedures (including critical care time)  Medications Ordered in ED Medications  ondansetron (ZOFRAN-ODT) disintegrating tablet 4 mg (4 mg Oral Given 04/05/18 1749)  HYDROcodone-acetaminophen (NORCO/VICODIN) 5-325 MG per tablet 2 tablet (2 tablets Oral Given 04/05/18 1749)     Initial Impression / Assessment and Plan / ED Course  I have reviewed the triage vital signs and the nursing notes.  Pertinent labs & imaging results that were available during my care  of the patient were reviewed by me and considered in my medical decision making (see chart for details).  Clinical Course as of Apr 04 1848  Thu Apr 05, 2018  1732 Patient discussed with Dr. Christy Gentles. Patient has had same pain in the past related to PCOS. Her PE is negative for adenexal tenderness or surgical presenting belly. I will check labs. I do not see a need for imaging at this time. If labs unremarkable this is likely her chronic pain and she can f/u with obgy,   [KM]    Clinical Course User Index [KM] Alveria Apley, PA-C       Based on review of vitals, medical screening exam, lab work and/or imaging, there does not appear to be an acute, emergent etiology for the patient's symptoms. Counseled pt on good return precautions and encouraged both PCP and ED follow-up as needed.  Prior to discharge, I also discussed incidental imaging findings with patient in detail and advised appropriate, recommended follow-up in detail.  Clinical Impression: 1. Pelvic pain in female     Disposition: Discharge  Prior to providing a prescription for a controlled substance, I independently reviewed the patient's recent prescription history on the [facility]. The patient had no recent or regular prescriptions and was deemed appropriate for a brief, less than 3 day prescription of narcotic for acute analgesia.  This  note was prepared with assistance of Systems analyst. Occasional wrong-word or sound-a-like substitutions may have occurred due to the inherent limitations of voice recognition software.   Final Clinical Impressions(s) / ED Diagnoses   Final diagnoses:  Pelvic pain in female    ED Discharge Orders         Ordered    ondansetron (ZOFRAN ODT) 4 MG disintegrating tablet  Every 8 hours PRN     04/05/18 1850           Kristine Royal 04/05/18 1850    Ripley Fraise, MD 04/05/18 2129

## 2018-04-06 ENCOUNTER — Encounter (HOSPITAL_COMMUNITY): Payer: Self-pay | Admitting: *Deleted

## 2018-04-06 ENCOUNTER — Other Ambulatory Visit: Payer: Self-pay

## 2018-04-06 ENCOUNTER — Emergency Department (HOSPITAL_COMMUNITY)
Admission: EM | Admit: 2018-04-06 | Discharge: 2018-04-07 | Disposition: A | Discharge status: Home / Self Care | Payer: Medicaid Other | Attending: Emergency Medicine | Admitting: Emergency Medicine

## 2018-04-06 DIAGNOSIS — E119 Type 2 diabetes mellitus without complications: Secondary | ICD-10-CM | POA: Insufficient documentation

## 2018-04-06 DIAGNOSIS — R7401 Elevation of levels of liver transaminase levels: Secondary | ICD-10-CM

## 2018-04-06 DIAGNOSIS — E079 Disorder of thyroid, unspecified: Secondary | ICD-10-CM | POA: Insufficient documentation

## 2018-04-06 DIAGNOSIS — R109 Unspecified abdominal pain: Secondary | ICD-10-CM

## 2018-04-06 DIAGNOSIS — R740 Nonspecific elevation of levels of transaminase and lactic acid dehydrogenase [LDH]: Secondary | ICD-10-CM | POA: Insufficient documentation

## 2018-04-06 DIAGNOSIS — Z79899 Other long term (current) drug therapy: Secondary | ICD-10-CM | POA: Insufficient documentation

## 2018-04-06 DIAGNOSIS — R112 Nausea with vomiting, unspecified: Secondary | ICD-10-CM | POA: Insufficient documentation

## 2018-04-06 DIAGNOSIS — R319 Hematuria, unspecified: Secondary | ICD-10-CM

## 2018-04-06 DIAGNOSIS — R1030 Lower abdominal pain, unspecified: Secondary | ICD-10-CM | POA: Insufficient documentation

## 2018-04-06 DIAGNOSIS — F17210 Nicotine dependence, cigarettes, uncomplicated: Secondary | ICD-10-CM | POA: Insufficient documentation

## 2018-04-06 LAB — GC/CHLAMYDIA PROBE AMP (~~LOC~~) NOT AT ARMC
Chlamydia: NEGATIVE
Neisseria Gonorrhea: NEGATIVE

## 2018-04-06 MED ORDER — ONDANSETRON HCL 4 MG/2ML IJ SOLN
4.0000 mg | Freq: Once | INTRAMUSCULAR | Status: AC
  Administered 2018-04-06: 4 mg via INTRAVENOUS
  Filled 2018-04-06: qty 2

## 2018-04-06 NOTE — ED Triage Notes (Addendum)
Pt reports vomiting since Saturday. Left lower abdominal pain for about 30 minutes ago. "light" vaginal bleeding since yesterday.  Pt says that she has an IUD and thinks she may be pregnant, but urine pregnancy has been negative. OBGYN is in [place]. Seen at [facility] last night for same symptoms.

## 2018-04-06 NOTE — ED Notes (Signed)
Nurse starting IV and will draw labs. 

## 2018-04-06 NOTE — ED Provider Notes (Signed)
[facility] EMERGENCY DEPARTMENT Provider Note   CSN: [id number] Arrival date & time: 04/06/18  2042    History   Chief Complaint Chief Complaint  Patient presents with  . Emesis    HPI Karen Drake is a 23 y.o. female with a history of familial adenomatous polyposis s/p colectomy in 2012, AUB in the setting of PCOS, prediabetes, GERD, and fatty liver who presents the emergency department with a chief complaint of vomiting.  Patient endorses nausea and daily vomiting x4 for the last week.  Vomiting is not associated with eating.  No other aggravating or alleviating factors.  The patient states "I'm sure I'm pregnant I'm having all the signs. When my mom was pregnant she had many, many negative tests, and they didn't know until she had an ultrasound, but no one will give me an ultrasound."  She reports that she has been having other signs and symptoms including breast tenderness and increased sense of smell.  She also reports a 20+ weight gain over the last few months. Reports her weight was in the 180's and was 207 last week, now down to 204.   She reports that she developed intermittent, "sharp", non-radiating periumbilical and bilateral lower abdominal pain over the last week.  Reports associated urinary frequency.  She denies hematuria, urinary urgency or hesitancy, vaginal pain, bleeding, or discharge, melena, or hematochezia.  She reports that she is also intermittently been having loose stools and constipation.  She reports that she has had 2 bowel movements earlier today, both of which were soft.  She was seen for the same symptoms yesterday at [facility], ER after speaking with OB/GYN advised the patient to.  Pelvic exam demonstrated mild vaginal bleeding and IUD strings were in place.  She returned to the ER today because she developed pain in her left flank just prior to arrival.  Pain is sharp and constant.  No known aggravating or alleviating factors.  She  denies of fever, chills, shortness of breath, or chest pain.  She reports that she has been having a headache with some intermittent dizziness over the last few days along with the nausea and vomiting.  She had an IUD placed approximately 3 weeks ago.  Her last menstrual cycle was in December 2019.  She is sexually active with one female partner and last had intercourse sometime within the last week.    Per chart review, gynecology team believes her symptoms are most likely GI in etiology. Last EGD 10/19 which demonstrated mild chronic gastritis was negative for H. Pylori and a few gastric polyps that were resected and retrieved.  Last sigmoidoscopy was 2017.  She underwent pelvic ultrasound on 02/27/18 at [facility], which was normal.   OBGYN: Dr. Erline Levine at [facility]  GI: Dr. Noah Charon at [facility]       The history is provided by the patient. No language interpreter was used.    Past Medical History:  Diagnosis Date  . Acid reflux   . 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 FAP (familial adenomatous polyposis)   . Fatty liver   . Hiatal hernia   . Polycystic ovary syndrome   . Thyroid disease     There are no active problems to display for this patient.   Past Surgical History:  Procedure Laterality Date  . COLON SURGERY     total colectomy  . CYST REMOVAL LEG Right    posterior right knee     OB  History    Gravida  0   Para  0   Term  0   Preterm  0   AB  0   Living  0     SAB  0   TAB  0   Ectopic  0   Multiple  0   Live Births  0            Home Medications    Prior to Admission medications   Medication Sig Start Date End Date Taking? Authorizing Provider  acetaminophen (TYLENOL) 500 MG tablet Take 1,000 mg by mouth every 6 (six) hours as needed for mild pain.   Yes [provider]  diphenhydrAMINE (BENADRYL) 25 mg capsule Take 25 mg by mouth every 6 (six) hours as needed for allergies.   Yes [provider]   levonorgestrel (MIRENA) 20 MCG/24HR IUD 1 each by Intrauterine route once. Implanted 03/16/18   Yes [provider]  levothyroxine (SYNTHROID, LEVOTHROID) 25 MCG tablet Take 25 mcg by mouth daily. 10/20/17  Yes [provider]  Multiple Vitamins-Minerals (MULTIVITAMIN GUMMIES ADULTS PO) Take 1 tablet by mouth daily.    Yes [provider]  ondansetron (ZOFRAN) 4 MG tablet Take 1 tablet (4 mg total) by mouth every 6 (six) hours. 01/25/18  Yes Jayda White A, PA-C  nitrofurantoin, macrocrystal-monohydrate, (MACROBID) 100 MG capsule Take 1 capsule (100 mg total) by mouth 2 (two) times daily for 5 days. 04/07/18 04/12/18  Timoteo Carreiro A, PA-C  ondansetron (ZOFRAN ODT) 4 MG disintegrating tablet Take 1 tablet (4 mg total) by mouth every 8 (eight) hours as needed for up to 3 days for nausea or vomiting. 04/07/18 04/10/18  Alaja Goldinger, Laymond Purser, PA-C    Family History Family History  Problem Relation Age of Onset  . Cancer Other   . Familial polyposis Mother   . Familial polyposis Brother     Social History Social History   Tobacco Use  . Smoking status: Current Every Day Smoker    Packs/day: 0.25    Types: Cigarettes  . Smokeless tobacco: Never Used  Substance Use Topics  . Alcohol use: Never    Frequency: Never  . Drug use: Never     Allergies   Penicillins; Pineapple; Nsaids; and Tape   Review of Systems Review of Systems  Constitutional: Positive for unexpected weight change (gain). Negative for activity change, chills and fever.  Respiratory: Negative for shortness of breath.   Cardiovascular: Negative for chest pain.  Gastrointestinal: Positive for abdominal pain, nausea and vomiting. Negative for anal bleeding, blood in stool, constipation and diarrhea.  Genitourinary: Positive for frequency, menstrual problem and pelvic pain. Negative for difficulty urinating, dysuria, vaginal discharge and vaginal pain.  Musculoskeletal: Negative for back pain, gait problem  and neck pain.  Skin: Negative for rash.  Allergic/Immunologic: Negative for immunocompromised state.  Neurological: Negative for headaches.  Psychiatric/Behavioral: Negative for confusion.    Physical Exam Updated Vital Signs BP 119/88 (BP Location: Left Arm)   Pulse 86   Temp 98.1 F (36.7 C) (Oral)   Resp 19   Ht 5\' 8"  (1.727 m)   Wt 104.3 kg   SpO2 100%   BMI 34.97 kg/m   Physical Exam Vitals signs and nursing note reviewed.  Constitutional:      General: She is not in acute distress. HENT:     Head: Normocephalic.  Eyes:     General: No scleral icterus.    Conjunctiva/sclera: Conjunctivae normal.  Neck:  Musculoskeletal: Neck supple.  Cardiovascular:     Rate and Rhythm: Normal rate and regular rhythm.     Heart sounds: Normal heart sounds. No murmur. No friction rub. No gallop.   Pulmonary:     Effort: Pulmonary effort is normal. No respiratory distress.     Breath sounds: No stridor. No wheezing or rales.  Chest:     Chest wall: No tenderness.  Abdominal:     General: Bowel sounds are normal. There is no distension.     Palpations: Abdomen is soft. There is no mass.     Tenderness: There is abdominal tenderness. There is no right CVA tenderness, left CVA tenderness, guarding or rebound.     Hernia: No hernia is present.     Comments: Tender to palpation in the left and right pelvic regions and suprapubic region.  Abdomen is soft, nondistended.  Normoactive bowel sounds.  No CVA tenderness bilaterally.  No tenderness over McBurney's point.  Negative Murphy sign.  No peritoneal signs.  Skin:    General: Skin is warm.     Findings: No rash.  Neurological:     Mental Status: She is alert.  Psychiatric:        Behavior: Behavior normal.      ED Treatments / Results  Labs (all labs ordered are listed, but only abnormal results are displayed) Labs Reviewed  CBC WITH DIFFERENTIAL/PLATELET - Abnormal; Notable for the following components:      Result  Value   WBC 11.0 (*)    All other components within normal limits  COMPREHENSIVE METABOLIC PANEL - Abnormal; Notable for the following components:   AST 59 (*)    ALT 53 (*)    All other components within normal limits  LIPASE, BLOOD - Abnormal; Notable for the following components:   Lipase 53 (*)    All other components within normal limits  URINALYSIS, ROUTINE W REFLEX MICROSCOPIC - Abnormal; Notable for the following components:   APPearance HAZY (*)    Hgb urine dipstick MODERATE (*)    All other components within normal limits  URINE CULTURE  I-STAT BETA HCG BLOOD, ED ([facility], [facility], [facility] ONLY)    EKG None  Radiology No results found.  Procedures Procedures (including critical care time)  Medications Ordered in ED Medications  ondansetron (ZOFRAN) injection 4 mg (4 mg Intravenous Given 04/06/18 2334)     Initial Impression / Assessment and Plan / ED Course  I have reviewed the triage vital signs and the nursing notes.  Pertinent labs & imaging results that were available during my care of the patient were reviewed by me and considered in my medical decision making (see chart for details).  Clinical Course as of Apr 07 335  Fri Apr 06, 2018  1155 Notified by nursing staff that the patient was requesting to leave so I went to talk with the patient.  She states " something came up and I want to leave".  I discussed with the patient that her only blood work that was back was her CBC and everything else was pending.  I discussed with the patient that if she was leaving at this point that I would be [place].    [MM]    Clinical Course User Index [MM] Makeyla Govan, Laymond Purser, PA-C       23 year old female with a history of familial adenomatous polyposis s/p colectomy in 2012, AUB in the setting of PCOS, prediabetes, GERD, and fatty liver presenting with nausea  and vomiting intermittently for the last week, lower abdominal and pelvic pain, and left flank pain.  She has no  constitutional symptoms.  This patient is well-known to this emergency department with 11 visits over the last 6 months.  This is my third personal evaluation of the patient since January.   The patient's medical record has been reviewed, including care everywhere.  She does not have a surgical abdomen today based on my exam.  Although she was evaluated 24 hours ago, now she is having left flank pain and continues to have vomiting.  The patient's primary concern is that she is pregnant and is requesting a pelvic ultrasound.  It appears that she had a pelvic ultrasound on 02/27/17 and ordered a pelvic ultrasound during a this ER visit in January 2020.  We had a long discussion regarding the hormone hCG and how a pregnancy test works.  All questions were answered.  While awaiting her labs, I did notice that the patient has had hematuria on her UA several times, but has not had a urine culture ordered.  Will order a urine culture today.  Perhaps the patient has an undiagnosed UTI since she did endorse urinary frequency.  I have a low suspicion for pyelonephritis despite the patient's left flank pain.  She had a negative renal stone study in January so low suspicion for obstructive uropathy today.   As mentioned above, I was notified by nursing staff that the patient was requesting to leave.  I discussed that only her CBC was back.  I discussed that if she was leaving at this time that it would be AGAINST MEDICAL ADVICE.  I have discussed my concerns as a provider and the possibility that this may worsen. We discussed the nature, risks and benefits, and alternatives to treatment. I have specifically discussed that without further evaluation I cannot guarantee there is not a life threatening event occuring.  Time was given to allow the opportunity to ask questions and consider the options, and after the discussion, the patient decided to refuse the offered treatment. Pt is A&Ox4, her own POA and states  understanding of my concerns and the possible consequences. After refusal, I made every reasonable opportunity to treat them to the best of my ability. I have made the patient aware that this is an [place] discharge, but she may return at any time for further evaluation and treatment.  I think it is reasonable given the fact that she is having urinary symptoms and previous UAs with hematuria to treat her for UTI.  She has an anaphylactic allergy to penicillin so I will treat her with Macrobid.  I have advised her to follow-up with GI as it does not appear that she has had an appointment in some time.  Patient is aware that pregnancy test, metabolic panel, lipase, and urinalysis are all pending at the time she chose to leave AMA.   Final Clinical Impressions(s) / ED Diagnoses   Final diagnoses:  Hematuria, unspecified type  Left flank pain  Lower abdominal pain  Nausea and vomiting, intractability of vomiting not specified, unspecified vomiting type  Transaminitis    ED Discharge Orders         Ordered    nitrofurantoin, macrocrystal-monohydrate, (MACROBID) 100 MG capsule  2 times daily     04/07/18 0020    ondansetron (ZOFRAN ODT) 4 MG disintegrating tablet  Every 8 hours PRN     04/07/18 0021  Joline Maxcy A, PA-C 04/07/18 3729    Varney Biles, MD 04/07/18 [phone]

## 2018-04-07 LAB — COMPREHENSIVE METABOLIC PANEL
ALT: 53 U/L — ABNORMAL HIGH (ref 0–44)
AST: 59 U/L — ABNORMAL HIGH (ref 15–41)
Albumin: 4.4 g/dL (ref 3.5–5.0)
Alkaline Phosphatase: 54 U/L (ref 38–126)
Anion gap: 10 (ref 5–15)
BUN: 7 mg/dL (ref 6–20)
CO2: 24 mmol/L (ref 22–32)
Calcium: 9.8 mg/dL (ref 8.9–10.3)
Chloride: 103 mmol/L (ref 98–111)
Creatinine, Ser: 0.79 mg/dL (ref 0.44–1.00)
GFR calc Af Amer: >60 mL/min (ref >60)
GFR calc non Af Amer: >60 mL/min (ref >60)
Glucose, Bld: 94 mg/dL (ref 70–99)
Potassium: 4.2 mmol/L (ref 3.5–5.1)
Sodium: 137 mmol/L (ref 135–145)
Total Bilirubin: 0.6 mg/dL (ref 0.3–1.2)
Total Protein: 7.4 g/dL (ref 6.5–8.1)

## 2018-04-07 LAB — CBC WITH DIFFERENTIAL/PLATELET
Abs Immature Granulocytes: 0.03 10*3/uL (ref 0.00–0.07)
Basophils Absolute: 0.1 10*3/uL (ref 0.0–0.1)
Basophils Relative: 1 %
Eosinophils Absolute: 0.3 10*3/uL (ref 0.0–0.5)
Eosinophils Relative: 3 %
HCT: 44.5 % (ref 36.0–46.0)
Hemoglobin: 14 g/dL (ref 12.0–15.0)
Immature Granulocytes: 0 %
Lymphocytes Relative: 26 %
Lymphs Abs: 2.9 10*3/uL (ref 0.7–4.0)
MCH: 29.5 pg (ref 26.0–34.0)
MCHC: 31.5 g/dL (ref 30.0–36.0)
MCV: 93.7 fL (ref 80.0–100.0)
Monocytes Absolute: 0.6 10*3/uL (ref 0.1–1.0)
Monocytes Relative: 6 %
Neutro Abs: 7.1 10*3/uL (ref 1.7–7.7)
Neutrophils Relative %: 64 %
Platelets: 357 10*3/uL (ref 150–400)
RBC: 4.75 MIL/uL (ref 3.87–5.11)
RDW: 12.5 % (ref 11.5–15.5)
WBC: 11 10*3/uL — ABNORMAL HIGH (ref 4.0–10.5)
nRBC: 0 % (ref 0.0–0.2)

## 2018-04-07 LAB — URINALYSIS, ROUTINE W REFLEX MICROSCOPIC
Bacteria, UA: NONE SEEN
Bilirubin Urine: NEGATIVE
Glucose, UA: NEGATIVE mg/dL
Ketones, ur: NEGATIVE mg/dL
Leukocytes,Ua: NEGATIVE
Nitrite: NEGATIVE
Protein, ur: NEGATIVE mg/dL
Specific Gravity, Urine: 1.018 (ref 1.005–1.030)
pH: 5 (ref 5.0–8.0)

## 2018-04-07 LAB — I-STAT BETA HCG BLOOD, ED (MC, WL, AP ONLY): I-stat hCG, quantitative: <5 m[IU]/mL (ref <5)

## 2018-04-07 LAB — LIPASE, BLOOD: Lipase: 53 U/L — ABNORMAL HIGH (ref 11–51)

## 2018-04-07 MED ORDER — ONDANSETRON 4 MG PO TBDP: 4.0000 mg | ORAL_TABLET | Freq: Three times a day (TID) | ORAL | 0 refills | Status: AC | PRN

## 2018-04-07 MED ORDER — NITROFURANTOIN MONOHYD MACRO 100 MG PO CAPS: 100.0000 mg | ORAL_CAPSULE | Freq: Two times a day (BID) | ORAL | 0 refills | Status: AC

## 2018-04-07 NOTE — ED Notes (Signed)
Discharge instructions and prescriptions dicussed with pt. Pt. verbalized understanding. No questions at this time.

## 2018-04-07 NOTE — Discharge Instructions (Addendum)
Thank you for allowing me to care for you today in the Emergency Department.   Your urine did show microscopic blood.  Follow-up with primary care to ensure that this resolves.  It is possible that it could be from a urinary tract infection. Your urine has been sent for culture, but this can take several days to grow in the lab.  In the meantime, take 1 tablet of Macrobid 2 times daily for the next 5 days.  If your urine culture is positive, you will receive a call from the hospital.  Take 1 tablet of Zofran once every 8 hours and let it dissolve in your tongue as needed for nausea or vomiting.  Take 1000 mg of Tylenol once every 8 hours for pain control.  Return to the emergency department if you develop uncontrollable vomiting despite taking Zofran, severe abdominal pain, abdominal pain with a high fever, severe shortness of breath, if you stop having bowel movements and passing gas, or other new, concerning symptoms.

## 2018-04-07 NOTE — ED Notes (Signed)
Pt states " I want to be discharged. I have something I need to take care off." pt. Informed lab work is pending. PA made aware.

## 2018-04-08 LAB — URINE CULTURE
Culture: 30000 — AB
Special Requests: NORMAL

## 2020-05-11 NOTE — Telephone Encounter (Signed)
Formatting of this note might be different from the original.  Patient called to inform she is concerned about the "fatty tumor or cyst on right leg"    Thyroid    Patient states she's having right leg pain and difficulty when walking.  Denies redness, draining, warmth or tenderness. States this areas has been monitored for the last 1-2 years but just starting to have pain from knee to ankle.  Patient does not have PCP and states "she is managed by [facility] doctors".     Request to please advise.  Electronically signed by Stark Klein, RN at 05/11/2020 10:46 AM EDT

## 2020-05-12 NOTE — Telephone Encounter (Signed)
Formatting of this note might be different from the original.  Patient needs to reschedule appointment for me. Last seen one year ago.  She can go to an urgent care to have them check out her leg if she does not have a PCP.   Recommend she establish care with a PCP.  Electronically signed by Rema Jasmine, MD at 05/12/2020  9:21 AM EDT

## 2020-05-14 NOTE — Telephone Encounter (Signed)
Formatting of this note might be different from the original.  Attempted to contact patient.  Mailbox is full and not accepting messages at this time.  Electronically signed by Stark Klein, RN at 05/14/2020  1:58 PM EDT

## 2020-05-14 NOTE — Telephone Encounter (Signed)
Formatting of this note might be different from the original.  Patient returned call, provider message given, she is going to go to [facility] and make an appointment with Dr. Nicholaus Bloom in the future.  Electronically signed by Beather Arbour, RN at 05/14/2020  3:09 PM EDT

## 2020-08-03 IMAGING — CT CT CERVICAL SPINE W/O CM
3 of 4 series · 12 of 33 positions shown, 14 images · non-contrast
Comparison: None.

CLINICAL DATA: Patient fell down stairs and presents with neck
pain.

EXAM:
CT CERVICAL SPINE WITHOUT CONTRAST
TECHNIQUE: Multidetector CT imaging of the cervical spine was performed without
intravenous contrast. Multiplanar CT image reconstructions were also
generated.

[Series 7: orthogonal bone · axial · 0.23mm/px · z∈[-255,-131]mm · 4 of 94 slices shown, 5 images]
[im 16/94  soft-tissue]
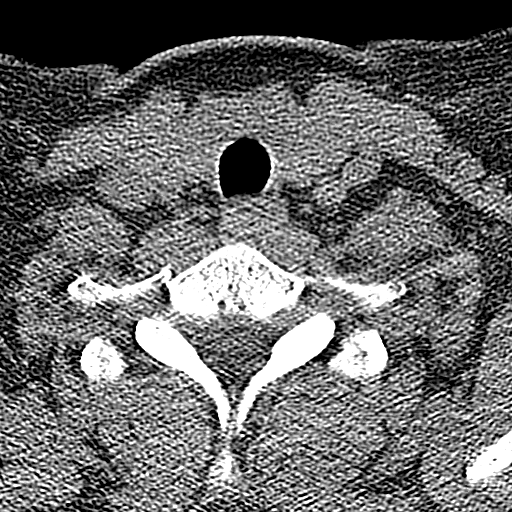
[im 16/94  bone]
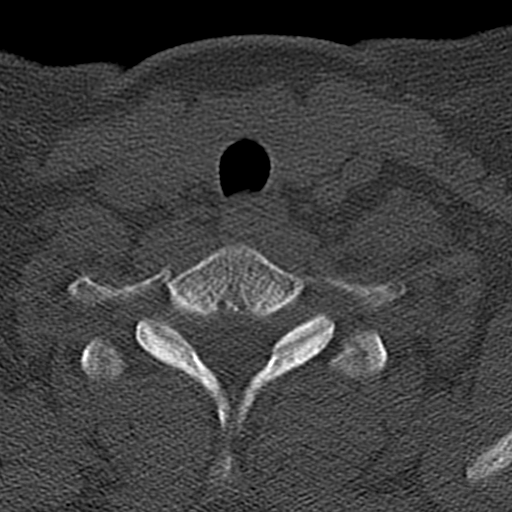
[im 32/94  bone]
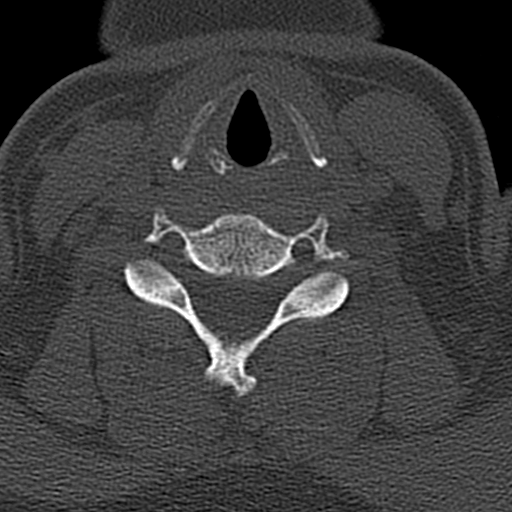
[im 63/94  bone]
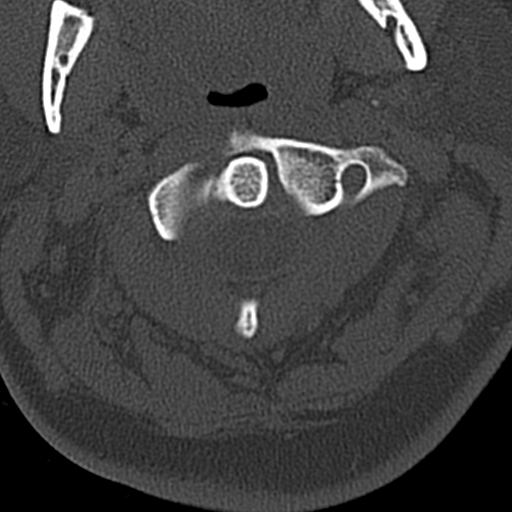
[im 78/94  bone]
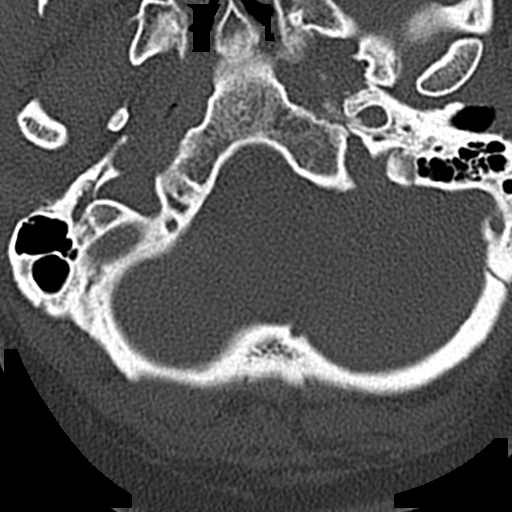

[Series 8: coronal bone · coronal · 0.26mm/px · 3 of 51 slices shown]
[im 11/51  bone]
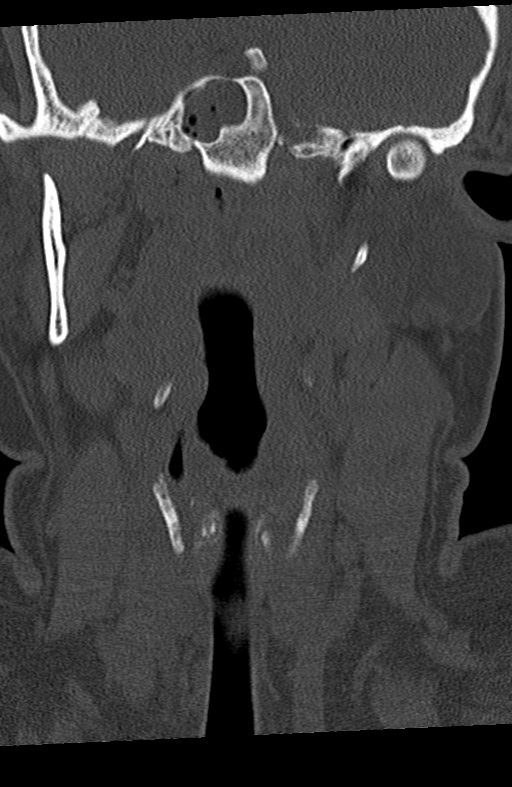
[im 21/51  bone]
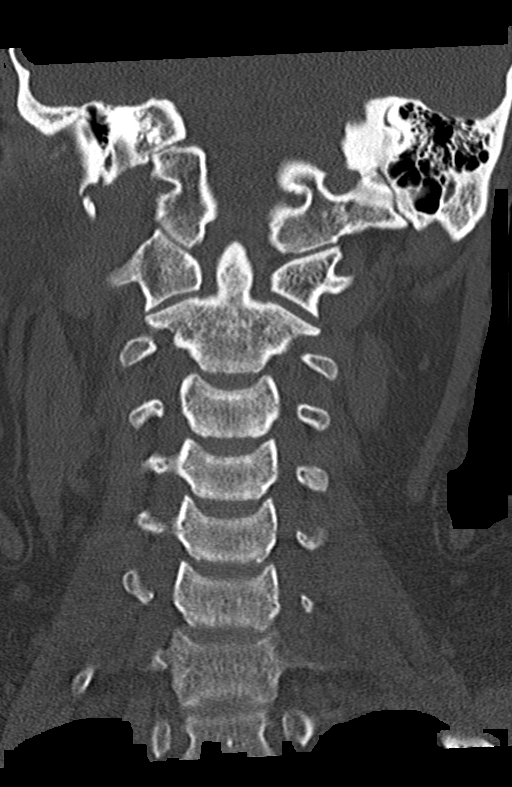
[im 31/51  bone]
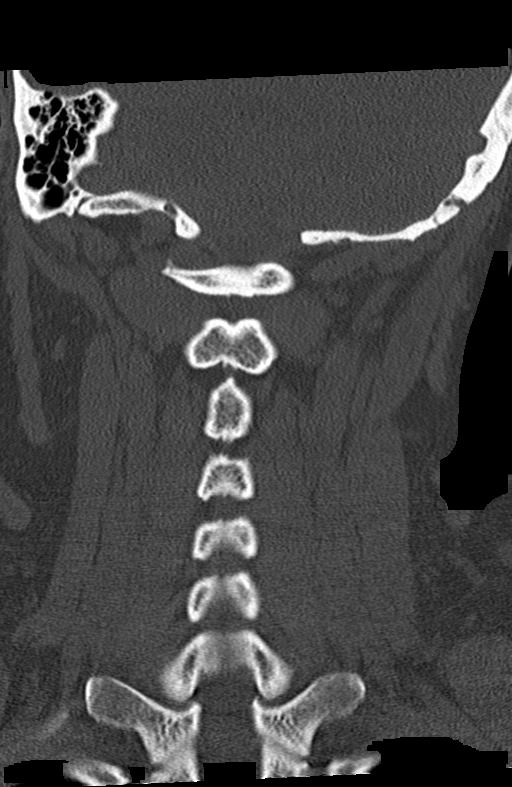

[Series 9: sagittal bone · sagittal · 0.26mm/px · 5 of 59 slices shown, 6 images]
[im 20/59  bone]
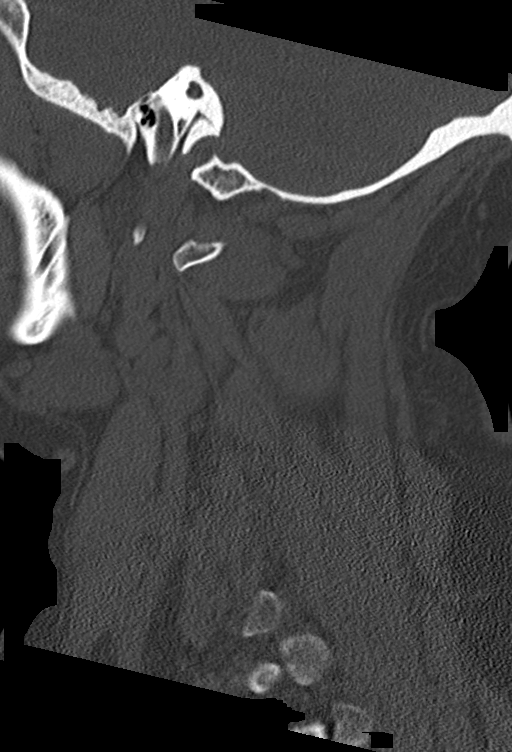
[im 25/59  bone]
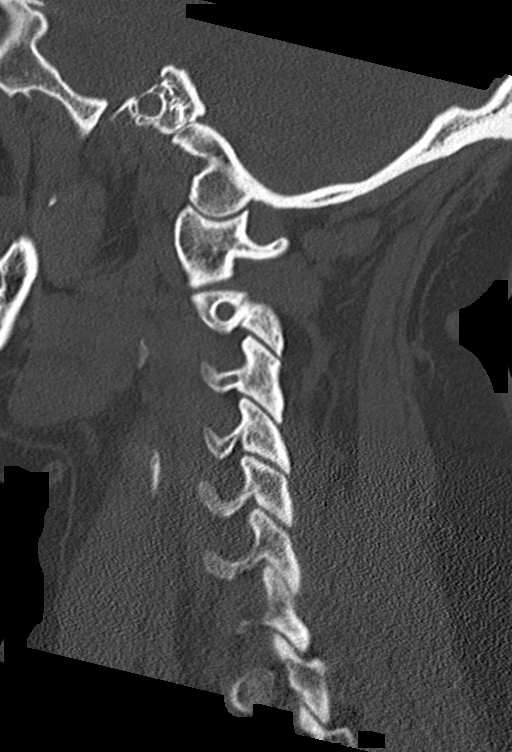
[im 30/59  soft-tissue]
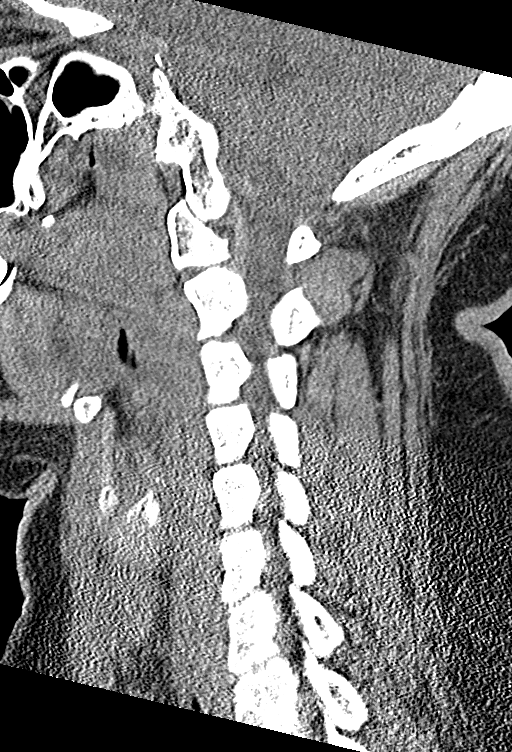
[im 30/59  bone]
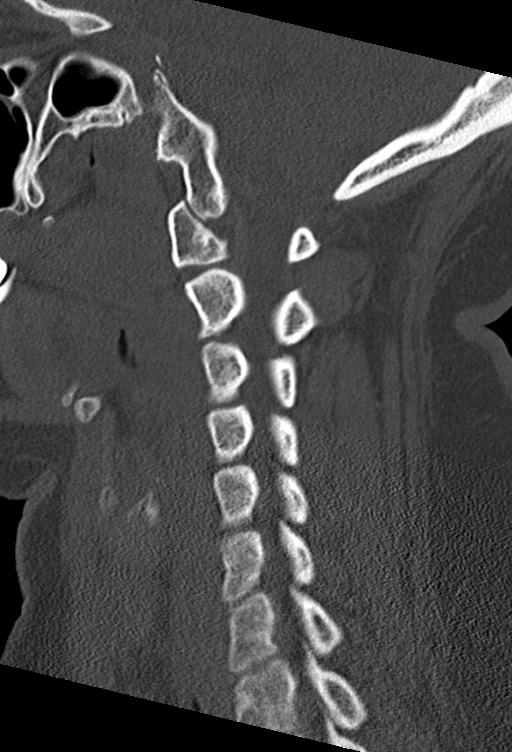
[im 34/59  bone]
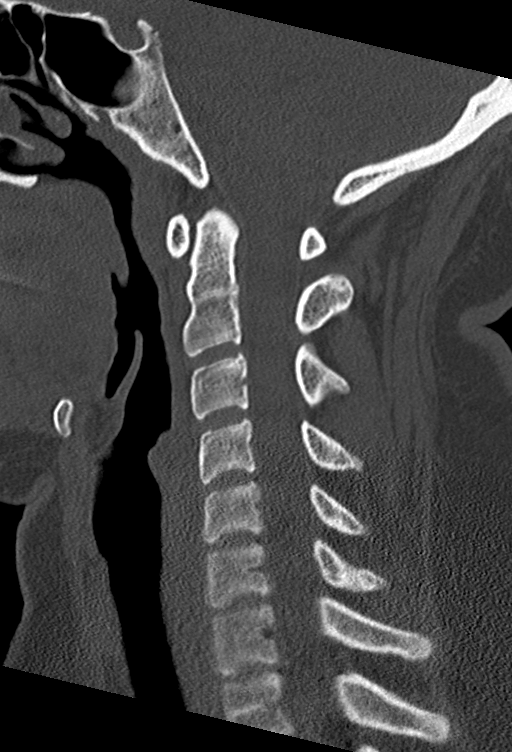
[im 39/59  bone]
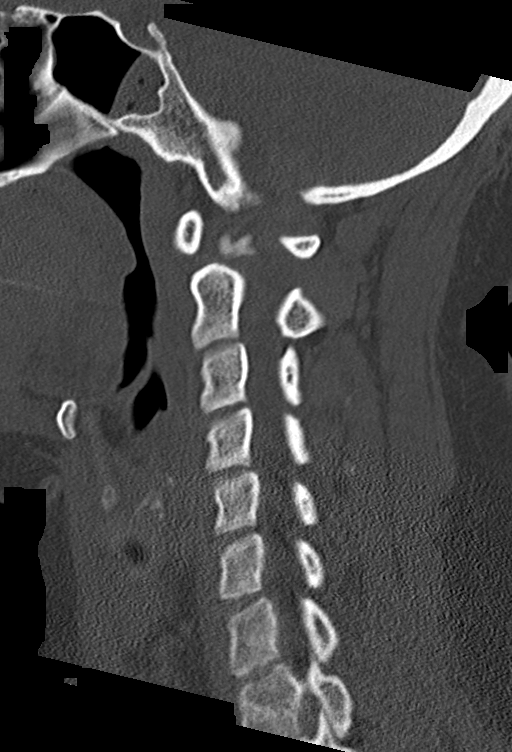

[12 of 33 positions shown; findings below may reference images not displayed]

FINDINGS: Alignment: There is straightening of cervical lordosis which may be
secondary to muscle spasm or patient positioning.

Skull base and vertebrae: No acute fracture. No primary bone lesion
or focal pathologic process.

Soft tissues and spinal canal: No prevertebral fluid or swelling. No
visible canal hematoma.

Disc levels: Maintained without focal disc herniation, canal
stenosis or significant foraminal encroachment. No jumped or perched
facets.

Upper chest: Negative.

Other: Mucosal thickening in the included right maxillary and right
sphenoid sinuses.
IMPRESSION: No acute cervical spine fracture or posttraumatic subluxation.

## 2021-01-06 NOTE — Progress Notes (Signed)
Formatting of this note is different from the original.  Images from the original note were not included.  Tyler Pita, MD  01/06/2021  9:52 AM    [facility]  CLINIC CONSULTATION    Name: Donna Andrade  DOB: 09/15/1995  MRN: [id number]  HAR: no account assigned    Requesting Provider:  Ardis Rowan    Consult Attending:  Dr Nigel Berthold    Chief Complaint:    Chief Complaint   Patient presents with    Back Pain     Chronic midline back pain without sciatica.       History of Present Illness:    Donna Andrade is a 26yrs old Female referred for neurosurgical consultation by Severiano Gilbert. The patient presented with a chief complaint of chronic midline low back pain.  Location:low back  Quality:dull, aching    Severity:7/10.  Duration: 6+ years  Timing:continuous   Context: aggravated by lifting,  Modifying Factors:NSAIDS did not improve pain  Associated signs and Sx:No radicular pain, numbness, tingling, weakness, and gait disturbance; denies any bowel or bladder dysfunction.    Past Medical History:    Past Medical History:   Diagnosis Date    Acid reflux     Anxiety     Depression     Diabetes (CMS/HCC)     resolved    Fatty liver     GERD (gastroesophageal reflux disease)     Hiatal hernia 09/2016    Insomnia     PCOS (polycystic ovarian syndrome)     Pinched nerve     PTSD (post-traumatic stress disorder)      Past Surgical History:    Past Surgical History:   Procedure Laterality Date    SUR - COLON SURGERY  2012    Colon Removal      Allergies:    Allergies   Allergen Reactions    Penicillins Anaphylaxis    Pineapple Extract [Bromelains] Anaphylaxis    Other Allergy      Paper tape     Medications:    Prior to Admission medications    Medication Sig   acetaminophen (TYLENOL) 500 mg Oral Tablet Take 2 Tablets by mouth every 6 hours as needed for Pain or Fever.   ARIPiprazole (ABILIFY) 2 mg Oral Tablet Take 2 mg by mouth at bedtime.   citalopram (CeleXA) 20 mg Oral Tablet Take 20 mg by mouth daily.    doxycycline (VIBRA-TABS) 100 mg Oral Tablet Take 100 mg by mouth twice a day.     Family History:    Family History   Problem Relation Name Age of Onset    Colon Cancer Mother      Asthma Mother      No Pertinent Family History Mother      Gall Stones Father       Social History:    Social History     Socioeconomic History    Marital status: Single     Spouse name: Not on file    Number of children: Not on file    Years of education: Not on file    Highest education level: Not on file   Occupational History    Not on file   Tobacco Use    Smoking status: Every Day     Packs/day: 0.50     Years: 2.00     Pack years: 1.00     Types: Cigarettes     Start date: 09/04/2014    Smokeless  tobacco: Current     Types: Snuff   Vaping Use    Vaping Use: Every day    Substances: Nicotine    Devices: Refillable tank   Substance and Sexual Activity    Alcohol use: No     Alcohol/week: 0.0 standard drinks    Drug use: Not Currently    Sexual activity: Yes     Partners: Male     Comment: NO BC   Other Topics Concern    Equipment/Assistive Technology No    Hearing Deficit No    Communication concerns No    Mobility Deficits No    Vision Deficits No   Social History Narrative    Not on file     Social Determinants of Health     Financial Resource Strain: Not on file   Food Insecurity: Not on file   Transportation Needs: Not on file   Physical Activity: Not on file   Stress: Not on file   Social Connections: Not on file   Intimate Partner Violence: Not on file   Housing Stability: Not on file     Review of Systems:   General: no fever, chills, night sweats, anorexia, malaise, fatigue, or weight change  Eyes: negative   Ears/Nose/Throat: negative  Respiratory: negative   Cardiovascular: negative  Gastrointestinal: negative  Genitourinary: negative  Musculoskeletal: muscle pain/soreness  Neurologic: negative  Integumentary: negative  Hematologic/Immunologic: negative  Endocrine: negative  Psychiatric: negative  Otherwise negative except  as noted above    Physical Exam:    Constitutional: Blood pressure 115/80, pulse 92, temperature 97.8 F (36.6 C), temperature source Oral, resp. rate 16, height 5\' 8"  (1.727 m), weight 180 lb (81.6 kg), SpO2 99 %.  Estimated body mass index is 27.37 kg/m as calculated from the following:    Height as of this encounter: 5\' 8"  (1.727 m).    Weight as of this encounter: 180 lb (81.6 kg).  General appearance:  26 year old in NAD  Cardiovascular:  Distal pulses are faint    Lymphatic:  no significant lymphadenopathy  Neuro:   Patient is awake and alert and oriented x 3.  Mood and affect are appropriate.  Coordination is grossly intact for fine finger movement.  Muscles stretch reflexes are 1+ at the right biceps, right brachioradialis, right triceps, right patella and right achilles and at the left biceps, left brachioradialis, left triceps, left patella and left achilles. No clonus, crossed adductors or other long tract signs. Plantar reflex is down-going.  Sensory exam is intact to light touch and pinprick in the right upper extremity, right lower extremity, left upper extremity and left lower extremity.    Musculoskeletal:  Gait and Station Gait is stable. Heel and toe walks well   Motor exam is 5/5 for right deltoids, right biceps, right triceps, right intrinsics, right iliopsoas, right quadriceps, right anterior tibialis, right extensor hallucis longus and right gastrocnemius and 5/5 for left deltoids, left biceps, left triceps, left intrinsics, left iliopsoas, left quadriceps, left anterior tibialis, left extensor hallucis longus and left gastrocnemius.   Tone in the right upper extremity, right lower extremity, left upper extremity and left lower extremity is without spasticity or rigidity.  No evidence of dislocation or laxity of the right upper extremity, right lower extremity, left upper extremity and left lower extremity.  Patient has a limited range of motion of the lumbar spine for flexion, extension and  rotation.     The right upper extremity, right lower extremity, left  upper extremity and left lower extremity have a  full range of motion.  Spurling's maneuver is negative.  Straight leg raising is negative .  Palpation of lumbar spine reveals tenderness to palpation of the paraspinal musculature.   Palpation of the right upper extremity, right lower extremity, left upper extremity, and left lower extremity  reveals no tenderness to palpation, no crepitation, no masses.  Inspection and palpation of skin and subcutaneous tissues of neck, back, right upper extremity, right lower extremity, left upper extremity, left lower extremity reveals  no rashes, lesions or ulcers.     Radiology: I have independently reviewed the X rays of the LS spine  The X rays of the LS spine shows no fractures or subluxation Disc spaces are well maintained  No osseous lesions    Data Review:  The patient's referral notes were reviewed.    Impression: Boyd KerbsKarissa L Patti a [age]yrs old Female who presents with chronic midline low back pain for 6+ years  no radiculopathy or myelopathy Her neurologic exam is non focal .We reviewed the X ray results and the results of he lumbar soft tissue U/S The X rays of the LS spine shows no fractures or subluxation Disc spaces are well maintained  No osseous lesions The U/S shows no soft tissue lesions    DECISION FOR SURGERY  The patient has chronic low back muscular pain  No indication for NRS intervention     Recommendation:Smoking cessation Continue conservative treatment Follow up with PCP    We had a discussion today on the risks of smoking to the integrity and longevity of cartilage. The patient was educated on the risk for continued degeneration at an accelerated rate, risk for increased incidence of pain and reduced ability to heal. Offered support for smoking cessation in the form of counseling.     Electronically signed by:  Tyler PitaKeith A Tucci, MD  01/06/2021  9:52 AM      Electronically signed by Tyler Pitaucci,  Keith A, MD at 01/06/2021 10:00 AM EST

## 2021-10-24 ENCOUNTER — Inpatient Hospital Stay
Admit: 2021-10-24 | Discharge: 2021-10-25 | Disposition: A | Discharge status: Home / Self Care | Attending: Emergency Medicine

## 2021-10-24 DIAGNOSIS — N3000 Acute cystitis without hematuria: Secondary | ICD-10-CM

## 2021-10-24 NOTE — ED Triage Notes (Signed)
Pt reports possible exposure to STD. Pt reports vaginal itching over a week. Denies discharge or pain.

## 2021-10-24 NOTE — ED Provider Notes (Incomplete)
[facility] EMERGENCY DEPT  EMERGENCY DEPARTMENT HISTORY AND PHYSICAL EXAM      Date: 10/24/2021  Patient Name: Donna Andrade  MRN: [id number]  Birthdate: Oct 31, 1995  Date of evaluation: 10/24/2021  Provider: Domingo Dimes, MD   Note Started: 7:27 PM EDT 10/24/21    HISTORY OF PRESENT ILLNESS     Chief Complaint   Patient presents with    Exposure to STD       History Provided By: Patient    HPI: Donna Andrade is a 26 y.o. female     PAST MEDICAL HISTORY   Past Medical History:  No past medical history on file.    Past Surgical History:  No past surgical history on file.    Family History:  No family history on file.    Social History:       Allergies:  Allergies   Allergen Reactions    Pcn [Penicillins]     Pineapple Concentrate        PCP: No primary care provider on file.    Current Meds:   No current facility-administered medications for this encounter.     No current outpatient medications on file.       Social Determinants of Health:   Social Determinants of Health     Tobacco Use: Not on file   Alcohol Use: Not on file   Financial Resource Strain: Not on file   Food Insecurity: Not on file   Transportation Needs: Not on file   Physical Activity: Not on file   Stress: Not on file   Social Connections: Not on file   Intimate Partner Violence: Not on file   Depression: Not on file   Housing Stability: Not on file       PHYSICAL EXAM   Physical Exam  Vitals and nursing note reviewed.   Constitutional:       General: She is not in acute distress.     Appearance: Normal appearance. She is normal weight. She is not ill-appearing, toxic-appearing or diaphoretic.   Genitourinary:     General: Normal vulva.   Neurological:      Mental Status: She is alert.       ***    SCREENINGS              LAB, EKG AND DIAGNOSTIC RESULTS   Labs:  No results found for this or any previous visit (from the past 12 hour(s)).    EKG:     Radiologic Studies:  Non-plain film images such as CT, Ultrasound and MRI are read by the  radiologist. Plain radiographic images are visualized and preliminarily interpreted by the ED Physician with the following findings: Not Applicable.    Interpretation per the Radiologist below, if available at the time of this note:  No orders to display        ED COURSE and DIFFERENTIAL DIAGNOSIS/MDM   CC/HPI Summary, DDx, ED Course, and Reassessment: 26 year old female presents with complaint of vaginal itching for 1 week, patient denies any vaginal discharge denies any white clumpy material in underwear, patient denies any burning urination, patient denies any new soaps or lotions, patient states last sexual relations was 4 months ago with her ex-husband and patient has secondhand information that he had an STD    Records Reviewed (source and summary of external notes): Prior medical records and Nursing notes    Vitals:    Vitals:    10/24/21 1912   BP: (!) 104/103   Pulse: 94  Resp: 19   Temp: 97.6 F (36.4 C)   SpO2: 99%   Weight: 81.6 kg (180 lb)   Height: 1.702 m (5\' 7" )        ED COURSE  Urinalysis  Chlamydia/gonorrhea urinalysis       Disposition Considerations (Tests not done, Shared Decision Making, Pt Expectation of Test or Treatment.): {Dispo considerations:59518}    Patient was given the following medications:  Medications - No data to display    CONSULTS: (Who and What was discussed)  None     Social Determinants affecting Dx or Tx: None    Smoking Cessation: Not Applicable    PROCEDURES   Unless otherwise noted above, none.  Procedures      CRITICAL CARE TIME   Patient does not meet Critical Care Time, 0 minutes    FINAL IMPRESSION   No diagnosis found.      DISPOSITION/PLAN   DISPOSITION      Discharge Note: The patient is stable for discharge home. The signs, symptoms, diagnosis, and discharge instructions have been discussed, understanding conveyed, and agreed upon. The patient is to follow up as recommended or return to ER should their symptoms worsen.      PATIENT REFERRED TO:  No follow-up  provider specified.      DISCHARGE MEDICATIONS:     Medication List      You have not been prescribed any medications.           DISCONTINUED MEDICATIONS:  There are no discharge medications for this patient.      I am the Primary Clinician of Record: Domingo Dimes, MD (electronically signed)    (Please note that parts of this dictation were completed with voice recognition software. Quite often unanticipated grammatical, syntax, homophones, and other interpretive errors are inadvertently transcribed by the computer software. Please disregards these errors. Please excuse any errors that have escaped final proofreading.)

## 2021-10-24 NOTE — ED Notes (Signed)
Discharge instructions reviewed and pt verbalized understanding. Pt able to ambulate out of ED with all belongings.     Peter Garter, RN  10/24/21 2029

## 2021-10-25 LAB — URINALYSIS WITH REFLEX TO CULTURE
Bilirubin Urine: NEGATIVE
Blood, Urine: NEGATIVE
Glucose, UA: NEGATIVE mg/dL
Nitrite, Urine: NEGATIVE
Protein, UA: 30 mg/dL — AB
Specific Gravity, UA: >1.03 — ABNORMAL HIGH (ref 1.003–1.030)
Urobilinogen, Urine: 0.2 EU/dL (ref 0.2–1.0)
pH, Urine: 6 (ref 5.0–8.0)

## 2021-10-25 MED ORDER — CEPHALEXIN 250 MG PO CAPS
250 MG | ORAL | Status: AC
Start: 2021-10-25 — End: 2021-10-24
  Administered 2021-10-25: 500 mg via ORAL

## 2021-10-25 MED ORDER — CEPHALEXIN 500 MG PO CAPS
500 MG | ORAL_CAPSULE | Freq: Two times a day (BID) | ORAL | 0 refills | Status: AC
Start: 2021-10-25 — End: 2021-10-27

## 2021-10-25 MED FILL — CEPHALEXIN 250 MG PO CAPS: 250 MG | ORAL | Qty: 2

## 2022-08-08 ENCOUNTER — Encounter: Payer: Medicaid Other | Admitting: Family

## 2022-08-08 ENCOUNTER — Encounter: Payer: Self-pay | Admitting: Family

## 2022-08-08 NOTE — Progress Notes (Signed)
Erroneous encounter-disregard
